# Patient Record
Sex: Female | Born: 1960 | Hispanic: No | Smoking: Former smoker
Health system: Southern US, Community
[De-identification: ages and names within clinical notes are randomized; demographics above are authoritative.]

## PROBLEM LIST (undated history)

## (undated) DIAGNOSIS — IMO0001 Reserved for inherently not codable concepts without codable children: Secondary | ICD-10-CM

## (undated) HISTORY — PX: NO PRIOR SURGERIES: 100

## (undated) HISTORY — DX: Reserved for inherently not codable concepts without codable children: IMO0001

## (undated) MED ORDER — ROBITUSSIN A-C 10-100 MG/5ML OR SYRP
ORAL_SOLUTION | ORAL | Status: DC
Start: 2001-10-01 — End: 2001-12-31

## (undated) MED ORDER — CLARITIN 10 MG OR TABS
ORAL_TABLET | ORAL | Status: DC
Start: 2000-08-13 — End: 2000-08-18

## (undated) MED ORDER — PROMETRIUM 100 MG OR CAPS
ORAL_CAPSULE | ORAL | Status: DC
Start: 2000-09-16 — End: 2001-11-26

## (undated) MED ORDER — PRENATAL/FOLIC ACID OR TABS
ORAL_TABLET | ORAL | Status: DC
Start: 2000-09-13 — End: 2001-11-26

## (undated) MED ORDER — ZYRTEC 10 MG OR TABS
ORAL_TABLET | ORAL | Status: DC
Start: 2000-08-18 — End: 2001-11-26

## (undated) MED ORDER — ENTEX PSE 120-600 MG OR TB12
EXTENDED_RELEASE_TABLET | ORAL | Status: DC
Start: 2001-10-01 — End: 2001-11-26

## (undated) MED ORDER — FLONASE INHA 50 MCG/DOSE NA
NASAL | Status: DC
Start: 2000-08-18 — End: 2001-11-26

## (undated) MED ORDER — PROGESTERONE 8 % VA GEL
VAGINAL | Status: DC
Start: 2000-11-02 — End: 2001-11-26

## (undated) MED ORDER — ERYTHROMYCIN BASE 333 MG OR TBEC
DELAYED_RELEASE_TABLET | ORAL | Status: DC
Start: 2001-10-01 — End: 2001-11-26

## (undated) DEATH — deceased

---

## 2000-08-13 ENCOUNTER — Encounter (INDEPENDENT_AMBULATORY_CARE_PROVIDER_SITE_OTHER): Payer: Self-pay | Admitting: Family Medicine

## 2000-08-13 ENCOUNTER — Ambulatory Visit (INDEPENDENT_AMBULATORY_CARE_PROVIDER_SITE_OTHER): Payer: PRIVATE HEALTH INSURANCE | Admitting: Family Medicine

## 2000-08-13 VITALS — BP 112/76 | HR 80 | Resp 16 | Wt 179.0 lb

## 2000-08-13 DIAGNOSIS — J309 Allergic rhinitis, unspecified: Secondary | ICD-10-CM

## 2000-08-13 NOTE — Progress Notes (Signed)
Connie [name] is a 39 year old female who presents complaining of sneezing, itchy watery eyes, sore throat for the past several weeks. Denies postnasal drip. States that at first thought that her symptoms were due to a URI but after 2-3 weeks when symptoms did not clear wondered if she was experiencing allergies. Symptoms are worse after sweeping and around perfumes. States that the allergies are the same indoor and outdoor. Tried OTC allergy meds which helped with sneezing. Does not smoke. Moved into current home back in April of 2001.    GNF:AOZHYQ of patient's past medical history indicates:   NO PAST MEDICAL HISTORY   MVH:QIONGE of patient's past surgical history indicates:   NO PAST SURGICAL HISTORY   Meds: denies  All:No Known Drug Allergies  PE:BP 112/76   Pulse 80   Resp 16   Wt 179 lbs (81.194 kg)  WEll developed well nourished female sitting on bed in NAD  HEENT:NCAT PEERL, conjunctiva noninjected, no discharge noted. Nares clear. Tm normal bilaterally. OP with MMM no erythema no exudates. Dentition good.  Neck:Soft, supple no LAN. No thyromegaly.    A/P:477.9 ALLERGIC RHINITIS NOS (primary encounter diagnosis)  Plan: CLARITIN TABS 10 MG OR   return prn failure to improve with above medication. Discussed avoidance of environmental triggers if patient is able to identify them.

## 2000-08-13 NOTE — Nursing Note (Signed)
>>   FATLAND, JODI     08/13/2000   10:16 am  Pt c/o allergic sx for some time.

## 2000-08-14 ENCOUNTER — Encounter (INDEPENDENT_AMBULATORY_CARE_PROVIDER_SITE_OTHER): Payer: Self-pay | Admitting: Family Medicine

## 2000-08-16 ENCOUNTER — Telehealth (INDEPENDENT_AMBULATORY_CARE_PROVIDER_SITE_OTHER): Payer: Self-pay | Admitting: Family Medicine

## 2000-08-16 NOTE — Telephone Encounter (Signed)
>>   BETSY VEVERKA Fri Aug 17, 2000 2:21 PM  Pt took her first dose of Clariton 4 days ago, but still has a stuffed nose   at night. She still has watery eyes and states her symptoms are the same.   She did get some relief with OTC medication. She has cleaned her room and   washed all linens.  Triaged per Eliot Ford and MD note,  appointment for tomorrow,  Suggested she use a humidifier at bedside.    >> PAT CARLTON Fri Aug 17, 2000 11:52 AM  [facility]    >> DIANE CROSS Fri Aug 17, 2000 8:55 AM  [facility]    >> DIANE CROSS Thu Aug 16, 2000 4:34 PM  [facility].    >> Wyline Copas Thu Aug 16, 2000 4:27 PM  >> CALL RECEIVED. Contact: Pt  Dr Brunilda Payor Pt   Pt was seen 08/13/00 and Rx'd Claritin. Pt states that there have been no effects and is wondering if she needs to wait a little longer. Pt may be reached at [phone] (home), VM ok if there are any suggestions.

## 2000-08-18 ENCOUNTER — Ambulatory Visit (INDEPENDENT_AMBULATORY_CARE_PROVIDER_SITE_OTHER): Payer: Self-pay | Admitting: Family Medicine

## 2000-08-18 ENCOUNTER — Ambulatory Visit (INDEPENDENT_AMBULATORY_CARE_PROVIDER_SITE_OTHER): Payer: PRIVATE HEALTH INSURANCE | Admitting: Internal Medicine

## 2000-08-18 VITALS — BP 106/82 | HR 80

## 2000-08-18 DIAGNOSIS — M79609 Pain in unspecified limb: Secondary | ICD-10-CM

## 2000-08-18 DIAGNOSIS — J309 Allergic rhinitis, unspecified: Secondary | ICD-10-CM

## 2000-08-18 NOTE — Progress Notes (Addendum)
Addended by: Cindi Carbon on: 08/19/2000,6:58 PM  Modules accepted: Progress Notes    The patient is a 39 year old year old female who presents for follow up of:    477.9 ALLERGIC RHINITIS NOS (primary encounter diagnosis)  Note: Attention is directed to the previous note in this chart. She has had no improvement with claritan. She has cat at n=home, FHA heat with uncleaned ducts. She stillhas itchy eyes, congestion and sore throat x one month. No previous known hx of all.    729.5 PAIN IN LIMB  Note: She complains of L 2nd toe cyts and nail deformity x a year or more.    There is no problem list on file for this patient.    Meds as of 08/18/2000:  ZYRTEC TABS 10 MG OR 1 PO QD  FLONASE INHA 50 MCG/DOSE NA 1 SPRAY EACH NOSTRIL QD REGARDLESS OF SYMPTOMS      Review of patient's past medical history indicates:   NO PAST MEDICAL HISTORY     Review of patient's past surgical history indicates:   NO PAST SURGICAL HISTORY   Review of patient's family history indicates:  No family history of allergies or asthma    Social History   Marital Status: Married     Social History Main Topics   Tobacco Use: Never          The remainder of the review of systems is negative.       PE: WD female in NAD, alert and cooperative  BP 106/82   Pulse 80  External ears and canals clear bilaterally. TM's normal bilaterally. Nose normal without lesions or discharge. Oropharynx normal. Neck supple without palpable adenopathy.    L 2nd toe cyst and nail deformity noted      A/P  477.9 ALLERGIC RHINITIS NOS (primary encounter diagnosis), possible to mold or dust or cat  Plan: ZYRTEC TABS 10 MG OR, FLONASE INHA 50 MCG/DOSE    NA   follow up if not resolved.     729.5 PAIN IN LIMB  Plan: CONSULT TO ORTHOPEDICS       Spent 15 min face to face couselling during this visit.

## 2000-08-18 NOTE — Nursing Note (Signed)
>>   Connie [name]     08/18/2000   9:36 am  Pt here for allergies. Sinus pressure at noc. Constant sore throat.   Keerbs placed her on Claritin a week ago and it has not worked.   Tried Actifed and it worked better.

## 2000-08-21 ENCOUNTER — Telehealth (INDEPENDENT_AMBULATORY_CARE_PROVIDER_SITE_OTHER): Payer: Self-pay | Admitting: Family Medicine

## 2000-08-21 NOTE — Telephone Encounter (Signed)
>>   Connie [name] Tue Aug 21, 2000 11:35 AM  Spoke w/pt. She is on Zyrtec x 3 days for allergies but it has not helped   yet; she only used the Flonase once b/c she is planning on getting   pregnant.  She tried to exercise right after using the Flonase yesterday, but became   quite dizzy so she stopped using it.  She is not sure what to do regarding her medications and her continuing   allergy sx.  Advised pt to continue the Zyrtec, OK to hold Flonase until seen tomorrow.    >> Connie [name] Tue Aug 21, 2000 11:15 AM  Pt returning Connie's call - no answer at ext. Pt apologized for being on   her phone, promises she won't use it until called back. Pt may be reached   at [phone] (home).    >> Connie [name] Tue Aug 21, 2000 11:04 AM  LMTCB.    >> Connie [name] Tue Aug 21, 2000 11:02 AM  >> CALL RECEIVED. Contact: Pt  Keerbs Pt.  Pt states that she was prescribed Zyrtec for her allergies, and she states that they are not helping her sx's. Pt would like a nurse to call her back to discuss this at [phone] (home)

## 2000-08-22 ENCOUNTER — Ambulatory Visit (INDEPENDENT_AMBULATORY_CARE_PROVIDER_SITE_OTHER): Payer: Self-pay | Admitting: Internal Medicine

## 2000-08-30 ENCOUNTER — Ambulatory Visit (INDEPENDENT_AMBULATORY_CARE_PROVIDER_SITE_OTHER): Payer: Self-pay | Admitting: Family Medicine

## 2000-09-06 ENCOUNTER — Ambulatory Visit (INDEPENDENT_AMBULATORY_CARE_PROVIDER_SITE_OTHER): Payer: Self-pay | Admitting: Family Medicine

## 2000-09-07 ENCOUNTER — Ambulatory Visit (INDEPENDENT_AMBULATORY_CARE_PROVIDER_SITE_OTHER): Payer: Self-pay | Admitting: Family Medicine

## 2000-09-13 ENCOUNTER — Ambulatory Visit (INDEPENDENT_AMBULATORY_CARE_PROVIDER_SITE_OTHER): Payer: PRIVATE HEALTH INSURANCE | Admitting: Family Medicine

## 2000-09-13 ENCOUNTER — Encounter (INDEPENDENT_AMBULATORY_CARE_PROVIDER_SITE_OTHER): Payer: Self-pay | Admitting: Family Medicine

## 2000-09-13 VITALS — BP 124/78 | HR 84 | Resp 16 | Wt 175.0 lb

## 2000-09-13 DIAGNOSIS — O039 Complete or unspecified spontaneous abortion without complication: Secondary | ICD-10-CM

## 2000-09-13 DIAGNOSIS — Z139 Encounter for screening, unspecified: Secondary | ICD-10-CM

## 2000-09-13 DIAGNOSIS — Z0000 Encounter for general adult medical examination without abnormal findings: Secondary | ICD-10-CM

## 2000-09-13 NOTE — Progress Notes (Signed)
SUBJECTIVE: Connie [name] is a 39 year old female for an annual health maintenance exam. She is a new patient to me, though has been seen in the clinic. Other concerns today include the following:   1. Is planning a pregnancy soon. See OB hx. Used progesterone supps for her successful pregnancy and would like to use these again. I discuss the utility of assessing a BBT chart to assess luteal phase.  2. Has had some sneezing and ST over the past few mos - the Claritin, Zyrtec and Flonase weren't very helpful, but used the latter 2 only a few d each. Actifed seemed more helpful than these. Not bothering her much now, and would rather not treat currently anyway as will be attempting pregnancy.    HCM TOPICS: Paps nl. Diet, exercise, BSE and calcium discussed. Hasn't had a mammo prev.    Review of patient's past medical history indicates:   NO PAST MEDICAL HISTORY   Review of patient's past surgical history indicates:   NO PAST SURGICAL HISTORY   Obstetric History   G3 P1 T1 P0 TAB0 SAB2 E0 M0 L1 - had a w/u for her SAbs; had a successful pregnancy thereafter while on progesterone suppositories.    MEDS:Current prescriptions: none currently    Social History   Marital Status: Married    Tobacco Use: Never     REVIEW OF SYSTEMS: Menses q 28, regular, not painful.    PHYSICAL EXAM:BP 124/78   Pulse 84   Resp 16   Wt 175 lbs (79.379 kg)   LMP 08/22/2000  HEENT: NC/AT. Ear canals clear. TM's normal bilaterally. Nose without lesions nor discharge. Gums and dentition normal. No sinus tenderness. Oropharynx normal. Neck supple; without palpable adenopathy. No thyroid enlargement or discrete nodule.  LUNGS: The lungs are clear to auscultation.  CV: Regular rate and rhythm. S1 and S2 normal, no murmurs, clicks, gallops or rubs. No edema or JVD.  BREASTS: Breasts are symmetric. No dominant, discrete, fixed or suspicious masses are noted. No skin or nipple changes or axillary adenopathy.  ABDOMEN: The abdomen is nondistended. Auscultation  reveals normal bowel sounds. No HSM nor mass noted by percussion and palpation. No tenderness to palpation.  GENITAL: Vagina and vulva are normal; no discharge is noted. Cervix normal. Uterus mobile, normal in size and shape without tenderness. Adnexa normal without masses or tenderness.  SKIN: No rashes or suspicious skin lesions noted. No abnormal hair changes or loss is noted.    ASSESSMENT AND PLAN:  V70.0 ROUTINE MEDICAL EXAM (primary encounter diagnosis)  Plan: PAP SMEAR (CYTOPATH, GYN)-[facility], SPECIMEN HANDLING FEE DR->LAB, MAMMOGRAM, BOTH BREASTS. Wellness counselling regarding all issues under HCM topics given.     V26.4 PROCREATIVE MGMT-COUNSEL  Plan: PRENATAL/FOLIC ACID TABS, RUBELLA IMMUNE STATUS, SEROL. I review the optimum time for trying to get pregnant - 3 days prior to the day after ovulation. I note decreased maternal caffeine intake may aid fertility. She should be on a PNV with a mg of folate to decrease the risk of spina bifida. Although I note that it would be advisable for her to assess her luteal phase by BBT or hormone level, she notes that she would prefer to use progesterone supps again given she has some urgency in getting pregnant given her age.     V82.9 SCREENING FOR CONDITION NOS  Plan: LIPID PANEL (RTC fasting)

## 2000-09-16 ENCOUNTER — Telehealth (INDEPENDENT_AMBULATORY_CARE_PROVIDER_SITE_OTHER): Payer: Self-pay | Admitting: Family Medicine

## 2000-09-16 ENCOUNTER — Encounter (INDEPENDENT_AMBULATORY_CARE_PROVIDER_SITE_OTHER): Payer: Self-pay | Admitting: Family Medicine

## 2000-09-16 DIAGNOSIS — O039 Complete or unspecified spontaneous abortion without complication: Secondary | ICD-10-CM | POA: Insufficient documentation

## 2000-09-16 DIAGNOSIS — Z3169 Encounter for other general counseling and advice on procreation: Secondary | ICD-10-CM | POA: Insufficient documentation

## 2000-09-16 NOTE — Telephone Encounter (Signed)
>>   Boyd Kerbs Sun Sep 16, 2000 2:39 PM  CALL INITIATED. Contact:   LM that micronized prog can be used instead of suppositories, as I have previously discussed w/ Fertility; typically 100 mg of Prometrium tid. Start with BBT spike and continue till period or till 8 wks. Will mail. There is a risk, albeit small, of hypospadias if had a boy.

## 2000-09-18 LAB — CERVICAL CANCER SCREENING: Cytologic Impression: NEGATIVE

## 2000-09-21 ENCOUNTER — Other Ambulatory Visit (INDEPENDENT_AMBULATORY_CARE_PROVIDER_SITE_OTHER): Payer: Self-pay | Admitting: Family Medicine

## 2000-09-21 DIAGNOSIS — Z139 Encounter for screening, unspecified: Secondary | ICD-10-CM

## 2000-11-02 ENCOUNTER — Telehealth (INDEPENDENT_AMBULATORY_CARE_PROVIDER_SITE_OTHER): Payer: Self-pay | Admitting: Family Medicine

## 2000-11-02 NOTE — Telephone Encounter (Signed)
>>   AIMEE HARGRAVE Fri Nov 02, 2000 6:45 PM  Called in to [facility] on [phone], and informed pt where   to pick up, pt has used this before and had no questions.    >> Boyd Kerbs Fri Nov 02, 2000 6:16 PM  Please phone in the rx to the pt's desired pharmacy (below) and let the pt   know this was done. If they don't do these, try [facility], and if not I am   sure [facility] would do. Thanks    >> JODI [facility] Fri Nov 02, 2000 10:58 AM  Please advise, thanks.    >> Alric Seton Fri Nov 02, 2000 10:41 AM  >> COMPLETED ON Fri Nov 02, 2000 10:45 AM  Pt is requesting to get the Progesterone Suppositories/ she is not really   wanting to take the pills with that slight risk of side effects.    Pharmacy information is below/ she is wanting to be sure that when staff   calls this in / that they have this. If not she would like to see what   pharmacies do have.     Pt given information on her Mammo with number to call to schedule this.    REFILL CALL  ------------  DOB: 1961/01/13  Medication(s) requested:Progesteron Supp  How many days of meds. left? 0  Pharmacy name and location:[facility] [address]   Pharmacy phone #:[phone]  Written prescription needed? Yes an orgianl rx for this was not written by   Dr. Emogene Morgan first and last name: Connie [name]  Caller's relation to pt: SELF  Tel. # to reach caller: [phone] (home)   Okay leave VM or msg with someone?YES  Pt has been informed of the 24 to 48 hour refill policy    >> PAMELA ALLEN Fri Nov 02, 2000 10:25 AM  >> CALL RECEIVED. Contact: pt  Dr. Johnston Ebbs pt    MEDICATION REQUEST    Pt states that she was into see Dr. Johnston Ebbs re this issue about a week ago and they discussed her taking Progesterone Suppositories for prevention of miscarriages.    REFILL CALL  ------------  DOB: 12-08-1960  Medication(s) requested:Progesteron Supp  How many days of meds. left? 0  Pharmacy name and location:[facility] [address]   Pharmacy phone  #:[phone]  Written prescription needed? Yes an orgianl rx for this was not written by Dr. Emogene Morgan first and last name: Connie [name]  Caller's relation to pt: SELF  Tel. # to reach caller: [phone] (home)   Okay leave VM or msg with someone?YES  Pt has been informed of the 24 to 48 hour refill policy

## 2001-05-27 ENCOUNTER — Other Ambulatory Visit: Admission: RE | Admit: 2001-05-27 | Discharge: 2001-05-27 | Payer: Self-pay | Admitting: Family Medicine

## 2001-10-01 ENCOUNTER — Ambulatory Visit (INDEPENDENT_AMBULATORY_CARE_PROVIDER_SITE_OTHER): Payer: PPO | Admitting: Internal Medicine

## 2001-10-01 VITALS — BP 102/64 | HR 88 | Temp 98.6°F | Resp 16 | Wt 178.0 lb

## 2001-10-01 NOTE — Progress Notes (Signed)
SUBJECTIVE:  Connie [name] is an 40 year old female who presents with URI. Symptoms include   congestion, coryza and sore throat. Onset 2 weeks, gradually worsening since   that time.     Meds as of 10/01/2001:  PROGESTERONE (VAGINAL) 8 % VA GEL 1 application of 8% progesterone vaginal gel qd for 10-12 d if pregnancy occurs; 1 application=90 mg  PROMETRIUM 100 MG OR CAPS 1 po tid  PRENATAL/FOLIC ACID TABS OR 1 TABLET DAILY  ZYRTEC 10 MG OR TABS 1 PO QD  FLONASE INHA 50 MCG/DOSE NA 1 SPRAY EACH NOSTRIL QD REGARDLESS OF SYMPTOMS      Review of patient's allergies indicates:   No Known Drug *      Tobacco Use: Never    Alcohol Use: Not Asked       OBJECTIVE:  BP 102/64   Pulse 88   Temp 98.6   Temp Src: Oral   Resp 16   Wt 178 lbs (80.740 kg)  General appearance: healthy, alert, no distress  Ears: R TM - normal, L TM - normal  Nose: normal  Oropharynx: mild erythema  Neck: normal, supple and no adenopathy  Lungs: clear to auscultation  Heart: normal rate, regular rhythm and no murmurs, clicks, or gallops    ASSESSMENT:  Acute URI    PLAN:  1) Symptomatic treatment with fluids, vaporizer, acetaminophen.  2) OTC cold medications.  3) Recheck as needed for persistence, worsening, appearance of new   symptoms.    PE:  Discussed viral etiology of URI and treatment rationale.  not to take abx unless not improved in 2-3 days

## 2001-11-26 ENCOUNTER — Ambulatory Visit (INDEPENDENT_AMBULATORY_CARE_PROVIDER_SITE_OTHER): Payer: PPO | Admitting: Family Medicine

## 2001-11-26 VITALS — BP 110/72 | HR 76 | Temp 98.4°F | Wt 178.0 lb

## 2001-11-26 MED ORDER — ALBUTEROL 90 MCG/ACT IN AERS
INHALATION_SPRAY | RESPIRATORY_TRACT | Status: DC
Start: 2001-11-26 — End: 2001-12-31

## 2001-11-26 MED ORDER — KEFLEX 500 MG OR CAPS
ORAL_CAPSULE | ORAL | Status: DC
Start: 2001-11-26 — End: 2001-12-31

## 2001-11-26 NOTE — Progress Notes (Signed)
SUBJECTIVE:  Connie [name] is a 40 year old female established patient who presents concerned about  1) prior URI lasted about 1 month   then 3 days ago she woke with a totally hoarse voice, green nasal d/c once this am     Review of patient's allergies indicates:    No Known Drug *   Current prescriptions:   ROBITUSSIN A-C 10-100 MG/5ML OR SYRP Take 1 to 2 teaspoonfuls every 4 to 6 hours as needed for cough    Tobacco Use: Never           Alcohol Use: No   trying to conceive but denies pregnancy   no N/V/D   no rash  OBJECTIVE:  BP 110/72   Pulse 76   Temp 98.4   Temp Src: Oral   Wt 178 lbs (80.740 kg)   LMP 11/16/2001   Skin:well perfused ,no rash.  Heent: NCAT, OP red some exudate ,shotty ant and no post cervical adenopathy , thick yellowish nasal d/c , neck is supple . No thyromegaly  TMs Rt nl  Lt nl  Chest:CTA, No wheezing.  COR: RRR no m,r,g.  No JVD or edema  Abd: +BS, soft NT no organomegaly     ASSESSMENT:  1)  461.9 ACUTE SINUSITIS NOS  (primary encounter diagnosis)  Current prescriptions:  KEFLEX 500 MG OR CAPS Take 1 capsule by mouth 2 times per day until gone ROBITUSSIN A-C 10-100 MG/5ML OR SYRP Take 1 to 2 teaspoonfuls every 4 to 6 hours as needed for cough       786.2 COUGH  Note: reviewed warning signs seek care if worse   ALBUTEROL 90 MCG/ACT IN AERS Inhale 2 puffs every 4 to 6 hours as needed

## 2001-11-26 NOTE — Nursing Note (Signed)
>>   [name], Connie A              11/26/2001 11:26 am  c/o loss of voice x 3 days

## 2001-12-31 ENCOUNTER — Telehealth (INDEPENDENT_AMBULATORY_CARE_PROVIDER_SITE_OTHER): Payer: Self-pay | Admitting: Internal Medicine

## 2001-12-31 ENCOUNTER — Ambulatory Visit (INDEPENDENT_AMBULATORY_CARE_PROVIDER_SITE_OTHER): Payer: PPO | Admitting: Internal Medicine

## 2001-12-31 ENCOUNTER — Encounter (INDEPENDENT_AMBULATORY_CARE_PROVIDER_SITE_OTHER): Payer: Self-pay | Admitting: Internal Medicine

## 2001-12-31 NOTE — Nursing Note (Signed)
>>   RODGERS, SHARON                   12/31/2001 2:32 pm  Pt here to have adoption forms filled out.

## 2001-12-31 NOTE — Telephone Encounter (Signed)
>>   Connie [name] Wed Jan 01, 2002 12:18 PM  ok    >> Connie [name] Wed Jan 01, 2002 12:17 PM  Test can be added on to sample drawn. Need ok/order from Dr.    >> Connie [name] Wed Jan 01, 2002 9:28 AM  Tammy Sours or [place], can this be done?    >> MARK J HOBBS Tue Dec 31, 2001 5:17 PM  Dr Festus Aloe pt    Pt states she had blood draws at today's OV and is inquiring if it is too late for her cholesterol level to also be checked with the existing blood sample. Pt may be reached at [phone] (home) .

## 2001-12-31 NOTE — Progress Notes (Signed)
This  patient, Connie [name] is a 41 year old year old female who presents with the following:      V70.3 MED EXAM NEC-ADMIN PURP  Note: She has pre adoption forms to be completed          A/PV70.3 MED EXAM NEC-ADMIN PURP  Note: See forms scanned to the chart.  Plan: IMMUNIZATION ADMIN, SINGLE IMM, TD 59 YEARS OLD        AND GREATER, PPD, HIV 1&2 AB SCREEN, URINE DRUG        ABUSE SCREEN, QUALI              I spent 25 min face to face couselling  and coordination during this visit.

## 2002-01-01 LAB — URINE DRUG ABUSE SCREEN, QUALI
Alcohol (Ethyl), URN: NEGATIVE mg/dL
Amphetamine (Qual), URN: NEGATIVE
Barbiturate (Qual), URN: NEGATIVE
Benzodiazepines (Qual), URN: NEGATIVE
Cocaine (Qual), URN: NEGATIVE
Methadone (Qual), URN: NEGATIVE
Opiates (Qual), URN: NEGATIVE
Phencyclidine (Qual), URN: NEGATIVE
Tricyclic Antidepressants, URN: NEGATIVE

## 2002-01-01 LAB — HIV 1&2 AB SCREEN

## 2002-01-03 ENCOUNTER — Telehealth (INDEPENDENT_AMBULATORY_CARE_PROVIDER_SITE_OTHER): Payer: Self-pay | Admitting: Internal Medicine

## 2002-01-03 ENCOUNTER — Ambulatory Visit (INDEPENDENT_AMBULATORY_CARE_PROVIDER_SITE_OTHER): Payer: PPO

## 2002-01-03 LAB — LIPID PANEL
Cholesterol/HDL Ratio: 6.1
HDL Cholesterol: 42 mg/dL (ref >40)
Total Cholesterol: 256 mg/dL — ABNORMAL HIGH (ref <200)
Triglyceride: 576 mg/dL — ABNORMAL HIGH (ref <150)

## 2002-01-03 LAB — PR PPD/TUBERCULIN SKIN TEST 5 UNITS / 0.1 ML INTRADERMAL

## 2002-01-03 NOTE — Telephone Encounter (Signed)
Encounter initiated.

## 2002-01-09 ENCOUNTER — Encounter (INDEPENDENT_AMBULATORY_CARE_PROVIDER_SITE_OTHER): Payer: Self-pay | Admitting: Internal Medicine

## 2002-01-16 ENCOUNTER — Institutional Professional Consult (permissible substitution): Payer: Self-pay | Admitting: Dermatology

## 2002-01-20 ENCOUNTER — Telehealth (INDEPENDENT_AMBULATORY_CARE_PROVIDER_SITE_OTHER): Payer: Self-pay | Admitting: Internal Medicine

## 2002-01-20 NOTE — Telephone Encounter (Signed)
>>   Connie [name] Mon Jan 20, 2002 12:35 PM  Informed pt papers were mailed to adoption service.    >> [name] [facility] Mon Jan 20, 2002 11:43 AM  Pt of Dr Festus Aloe    Pt requesting c\b in regards to adoption paperwork that needs to be filled out and returned to pt asap. Pt may be reached at [phone] (home) .

## 2002-01-22 ENCOUNTER — Encounter (INDEPENDENT_AMBULATORY_CARE_PROVIDER_SITE_OTHER): Payer: Self-pay | Admitting: Internal Medicine

## 2002-03-27 ENCOUNTER — Telehealth (INDEPENDENT_AMBULATORY_CARE_PROVIDER_SITE_OTHER): Payer: Self-pay | Admitting: Internal Medicine

## 2002-03-27 NOTE — Telephone Encounter (Signed)
>>   [name] Mar 29, 2002 10:35 AM  Pt notified of below.    >> [name] Fri Mar 28, 2002 8:53 AM  L/M for pt to call office.    >> [name] Thu Mar 27, 2002 4:48 PM  left message to call    >> [name] Thu Mar 27, 2002 3:39 PM  Please call her and let her know:  1. appointment is enough  2. recovery time: She will have a little skin irritation or blistering at the sites of skin tags for up to one week. Not likley to impair normal activitities, but may be cosmetically unexceptable for special events over that recovery period.  3. the treated areas are no more sunsensitive than normal skin.      >> [name] Mar 27, 2002 3:04 PM  Scheduled for skin tag removal 5/22. Has ten tags. Wants to know if one appt is enough time to remove them all? What is the recovery time? Are the removed tag sites sun-sensative? (starting vacation soon after appt). Please call: [phone]. Leave msg.

## 2002-03-27 NOTE — Telephone Encounter (Signed)
>>   [name] Thu Mar 27, 2002 4:34 PM  done      >> DANA CONDOLORA Thu Mar 27, 2002 9:50 AM  Pt notified re completed adoption paperwork. States she will be in clinic today to sign release of information to adoption agency representative Arvella Nigh. dc    >> [name] Thu Mar 27, 2002 8:27 AM  Please call her and let her know that I have completed her forms for adoption from [place]. I cannot return them to Arvella Nigh because I don't have a signed release of information to release her PHI.

## 2002-04-17 ENCOUNTER — Ambulatory Visit (INDEPENDENT_AMBULATORY_CARE_PROVIDER_SITE_OTHER): Payer: PPO | Admitting: Internal Medicine

## 2002-04-17 NOTE — Nursing Note (Signed)
>>   RODGERS, SHARON                 04/17/2002 10:13 am  Pt here for removal of skin tags.  Cholesterol check.

## 2002-04-17 NOTE — Progress Notes (Signed)
The patient is a 41 year old year old female who presents for follow up of:    272.4 HYPERLIPIDEMIA NEC/NOS  Note: She is on ww and wants to rechekc lipids.     701.9 SKIN HYPERTRO/ATROPH NOS  Note: Desires removal of skin tags. They are irritated by her clothing.      PE: WD female in NAD, alert and cooperative  BP 118/64   Pulse 82   Resp 14   Wt 167 lbs (75.751 kg)  8 tags ranging 1-7 mm at R ax, 1 tag @ R inframamary 4 mm,    3 @ L axilla1-4 mm    Frozen with LN2 x 2 x20 sec      A/P  272.4 HYPERLIPIDEMIA NEC/NOS  Note: She need labs checked.   Plan: LIPID PANEL, VENIPUNCTURE FEE, SPECIMEN    HANDLING FEE DR->LAB   I will notify patient of test results via mail.     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

## 2002-04-18 ENCOUNTER — Other Ambulatory Visit (INDEPENDENT_AMBULATORY_CARE_PROVIDER_SITE_OTHER): Payer: Self-pay

## 2002-04-24 ENCOUNTER — Other Ambulatory Visit (INDEPENDENT_AMBULATORY_CARE_PROVIDER_SITE_OTHER): Payer: PPO | Admitting: Internal Medicine

## 2002-04-24 ENCOUNTER — Other Ambulatory Visit (INDEPENDENT_AMBULATORY_CARE_PROVIDER_SITE_OTHER): Payer: PPO

## 2002-04-24 LAB — LIPID PANEL
Cholesterol (LDL): 69 mg/dL (ref <130)
Cholesterol/HDL Ratio: 3.4
HDL Cholesterol: 36 mg/dL — ABNORMAL LOW (ref >40)
Total Cholesterol: 123 mg/dL (ref <200)
Triglyceride: 90 mg/dL (ref <150)

## 2002-04-25 ENCOUNTER — Telehealth (INDEPENDENT_AMBULATORY_CARE_PROVIDER_SITE_OTHER): Payer: Self-pay | Admitting: Internal Medicine

## 2002-04-25 NOTE — Telephone Encounter (Signed)
>>   Connie [name] Wed Apr 30, 2002 5:13 PM  Ordered by Luisa Dago. Discussed with patient and husband. Reviewed lipid.    >> [name] Fri Apr 25, 2002 6:11 PM  Pt's spouse called to inquire as to why the pt's Rx for [facility]  had not been called in to pharmacy as of yet. I see no meds  listed for this pt and nothing in the chart documenting an  order. The pt's spouse said the order was for "the ring"-he said you gave pt a sample last week.

## 2002-06-06 ENCOUNTER — Encounter (INDEPENDENT_AMBULATORY_CARE_PROVIDER_SITE_OTHER): Payer: Self-pay | Admitting: Internal Medicine

## 2002-06-06 ENCOUNTER — Ambulatory Visit (INDEPENDENT_AMBULATORY_CARE_PROVIDER_SITE_OTHER): Payer: PPO | Admitting: Internal Medicine

## 2002-06-06 VITALS — BP 128/74 | HR 58 | Temp 100.5°F | Wt 163.5 lb

## 2002-06-06 LAB — CBC, DIFF
% Basophils: 1 %
% Eosinophils: 2 %
% Lymphocytes: 25 %
% Monocytes: 7 %
% Neutrophils: 65 %
Absolute Eosinophil Count: 0.13 10*3/uL (ref 0–0.50)
Absolute Lymphocyte Count: 1.75 10*3/uL (ref 1.00–4.80)
Basophils: 0.05 10*3/uL (ref 0–0.20)
Hematocrit: 38 % (ref 36–45)
Hemoglobin: 12.8 g/dL (ref 11.5–15.5)
MCH: 29.7 pg (ref 27.3–33.6)
MCHC: 33.6 g/dL (ref 32.3–35.7)
MCV: 88 fL (ref 81–98)
Monocytes: 0.45 10*3/uL (ref 0–0.80)
Neutrophils: 4.55 10*3/uL (ref 1.80–7.00)
Platelet Count: 192 10*3/uL (ref 150–400)
RBC: 4.33 mil/uL (ref 3.80–5.00)
WBC: 6.94 10*3/uL (ref 4.3–10.0)

## 2002-06-06 MED ORDER — TYLENOL WITH CODEINE #3 300-30 MG OR TABS
ORAL_TABLET | ORAL | Status: DC
Start: 2002-06-06 — End: 2003-03-11

## 2002-06-06 NOTE — Progress Notes (Signed)
06/06/2002  3:32 PM  Chart Reviewed.  Chief Complaint: 41 year old female presents with the following issues on which she wishes to focus attention:  cough    SUBJECTIVE:  1) COUGH: She had sneeezing and cough 8-9 d ago. She had no fever or ST or myalgia. Denies any hx of allergies. Hasn't felt feverish. No nausea or diarrhea. Just before this started she arrived home from a trip in which she was in [place] and [address]. Her brother had asthma, but she never did, although she says colds often go to her chest.     ROS: Constitutional: n  Eyes: n  Ears/Nose/Mouth/Throat: as above   Cardiovascular: n  Respiratory: no wheezing but the cough is worse with exertion and at night. Though it doens't wake her at night.   GI: n  GU: n  Musculoskeletal: n  Skin: n  Neurological: n     Current level of aerobic exercise: works out regularly    PROBLEM LIST: Patient Active Problem List:    PROCREATIVE MGMT-COUNSEL[V26.4]   H/O SPON ABORT x 2[634.90]    PMHx: Review of patient's past medical history indicates:   NO PAST MEDICAL HISTORY     Medications: Meds as of 06/06/2002:  NUVARING 0.12-0.015 MG/24HR VA RING weekly     SocHx: Social History   Marital Status: Married Spouse Name:    Years of Education: Number of children:   Social History Main Topics   Tobacco Use: Quit Packs/Day: .5 Years: 10    Quit date: 08/27/1989   Alcohol Use: Yes    Comment: 1-2 every coulpe of weeks   Drug Use: No    Sexually Active: Yes Partners with: Female   Comment: mutually mongamous since 34    Social History Narrative   Mom for 1 child, husband is a Administrator. Has lived in [place] since 1993.     OBJECTIVE:  VS: BP 128/74   Pulse 58   Temp 100.5   Wt 163 lbs 8 oz (74.163 kg)  General appearance: Weight appears normal for height. No acute distress.  Affect and demeanor are pleasant and outgoing. Normal cognition and thought process. Expresses thoughts coherently and fluently. No vegetative signs, no repetitive movements, no evidence of paranoid  ideation and no expression of self destructive thoughts.  HEENT: Pupils equal, reactive to light and accomodation. Sclerae and conjunctivae clear. Fundi benign. External ears and canals clear. TM's normal bilaterally. Nose normal without lesions or discharge. Gums and dentition normal. No sinus tenderness. Oropharynx normal. Neck supple without palpable adenopathy. No thyroid enlargemant or discrete nodules noted.  LUNGS: The lungs are clear to auscultation.    ASSESSMENT/PLAN:    786.2 COUGH (primary encounter diagnosis)  Note: doubt bacterial but will check with CBC  Plan: CBC, DIFF, VENIPUNCTURE FEE, SPECIMEN HANDLING    FEE DR->LAB, TYLENOL/CODEINE #3 300-30 MG OR    TABS   fluids for expectorant.      F/U: Call or return to clinic prn if these symptoms worsen, fail to improve as anticipated, or if new symptoms develop.     Patient Ed: Discussed in detail all aspects of the patients care described above. We agreed to discuss at a future appointment any topics for which there was insufficient time at todays meeting    Note to reviewer. The content of this evaluation includes items in the medical record such as allergies and aspects of past history items recorded in the chart, which may not have been specifically entered  again in this particular entry.

## 2002-06-06 NOTE — Nursing Note (Signed)
>>   AVAKIAN, DEAN                    06/06/2002 3:08 pm  cough and sore throat x 6 days.

## 2002-06-11 ENCOUNTER — Telehealth (INDEPENDENT_AMBULATORY_CARE_PROVIDER_SITE_OTHER): Payer: Self-pay | Admitting: Internal Medicine

## 2002-06-11 NOTE — Telephone Encounter (Signed)
>>   Connie [name] Mon Jun 16, 2002 1:21 PM  Pt. seen 06/13/02    >> Connie [name] Wed Jun 11, 2002 10:56 AM  Pt. seen 06/06/02 [name]) for cough, CBC normal. Attempted to reach pt. not at home at this time. Msg left w/ spouse to have pt. return call to office    >> [name] Wed Jun 11, 2002 9:31 AM  pt of [name], MD    Pt stated she thinks her Sx are taking a turn for the worse and requested a cb from an MA to discuss. Pt declined a SDA until after speaking to an MA.    RESULTS CALL  -------------  Results for what test(s)?Culture  Where was the test done?: here  When was test(s) done?: 06/06/02  Preferred phone number: after 11:30 am today at [phone] (home)   Okay to leave VM or msg with someone?YES

## 2002-06-13 ENCOUNTER — Ambulatory Visit (INDEPENDENT_AMBULATORY_CARE_PROVIDER_SITE_OTHER): Payer: PPO | Admitting: Internal Medicine

## 2002-06-13 VITALS — BP 100/80 | HR 88 | Temp 99.4°F | Resp 14 | Wt 163.0 lb

## 2002-06-13 NOTE — Progress Notes (Signed)
06/13/2002  3:35 PM  Chart Reviewed.  Chief Complaint: 41 year old female presents with the following issues on which she wishes to focus attention:  UTI    SUBJECTIVE:  1) COUGH Her WBC was normal. Since then it has changed. She gets increased coughing in the evening. She denies wheezing, but thinks she can't take a full breath without coughing. She isn't really SOB and has been able to workout without the cough getting worse. She still has a low grade fever.     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]   H/O SPON ABORT x 2[634.90]     PMHx: Review of patient's past medical history indicates:   NO PAST MEDICAL HISTORY     Medications: Meds as of 06/13/2002:  TYLENOL/CODEINE #3 300-30 MG OR TABS 1-2 PO Q 4-6 For Cough  NUVARING 0.12-0.015 MG/24HR VA RING weekly     SocHx: Social History   Marital Status: Married Spouse Name:    Years of Education: Number of children:   Social History Main Topics   Tobacco Use: Quit Packs/Day: .5 Years: 10    Quit date: 08/27/1989   Alcohol Use: Yes    Comment: 1-2 every coulpe of weeks   Drug Use: No    Sexually Active: Yes Partners with: Female   Comment: mutually mongamous since 54    Social History Narrative   Mom for 1 child, husband is a Administrator. Has lived in [place] since 1993.     OBJECTIVE:  VS: BP 100/80   Pulse 88   Temp 99.4   Temp Src: Oral   Resp 14   Wt 163 lbs (73.936 kg)  General appearance: Weight appears normal for height. No acute distress.  LUNGS: The lungs are clear to auscultation There is expiratory prolongation on forced expiration.    ASSESSMENT/PLAN:    786.2 COUGH (primary encounter diagnosis)  Note: viral bronchitis,  Plan: SPIROMETRY,  ONSITE PRE/POST TX   normal  Fluids, tylenol with codeine and more time. 25 minutes spent reviewing the interim history and counseling the patient .      F/U: if   Patient Ed: Discussed in detail all aspects of the patients care described above. We agreed to discuss at a future appointment any topics for which there was insufficient time at todays meeting    Note to reviewer. The content of this evaluation includes items in the medical record such as allergies and aspects of past history items recorded in the chart, which may not have been specifically entered again in this particular entry.

## 2002-08-22 ENCOUNTER — Ambulatory Visit (INDEPENDENT_AMBULATORY_CARE_PROVIDER_SITE_OTHER): Payer: PPO | Admitting: Family Medicine

## 2002-08-22 VITALS — BP 116/70 | HR 80 | Temp 99.1°F | Resp 18 | Ht 61.0 in | Wt 166.0 lb

## 2002-08-22 MED ORDER — CIPROFLOXACIN TABS 500 MG OR
ORAL_TABLET | ORAL | Status: DC
Start: 2002-08-22 — End: 2002-09-04

## 2002-08-22 MED ORDER — AMBIEN 10 MG OR TABS
ORAL_TABLET | ORAL | Status: DC
Start: 2002-08-22 — End: 2002-08-31

## 2002-08-22 MED ORDER — VIVOTIF BERNA VACCINE CPEC   OR
ENTERIC_COATED_CAPSULE | ORAL | Status: DC
Start: 2002-08-22 — End: 2003-03-11

## 2002-08-22 NOTE — Progress Notes (Signed)
Addended by: Barbaraann Barthel T on: 09/08/2002 11:38:31 AM     Modules accepted: Orders     Connie [name] is a 41 year old female for an adoption exam. Other concerns today include the following:    discussion of travel shots and prevention for travel to [place]   They have a 42 yr old boy   Her husband Gery Pray will join her in 2 weeks after her arrival they leave in 4 weeks   She had her pap and annual <1 yr ago    forms completed   PPD neg HIV NEG       Review of patient's allergies indicates:   No Known Drug *     Current outpatient prescriptions:   NUVARING 0.12-0.015 MG/24HR VA RING, weekly, Disp: 0, Rfl: 0   Tobacco Use: Quit Packs/Day: .5 Years: 2    Quit date: 08/27/1989   Alcohol Use: Yes    Comment: 1-2 every couple of weeks      ROS no shortness of breath or chest pain   no injuries that would stop her from travel   OBJECTIVE:   BP 116/70   Pulse 80   Temp 99.1   Temp Src: Oral   Resp 18   Ht 5\' 1"  (1.537m)   Wt 166 lbs (75.297 kg)   LMP 07/25/2002   comfortable appearing female in NAD     General: healthy, alert, no distress  Skin: Skin color, texture, turgor normal. No rashes or concerning lesions.  Head: Normocephalic. No masses, lesions, tenderness or abnormalities  Nose:normal  Oropharynx: Lips, mucosa, and tongue normal. Teeth and gums normal., posterior pharynx without erythema or drainage  Neck: Neck supple. No adenopathy. Thyroid symmetric, normal size, without nodules  Lungs: clear to auscultation  Heart: normal rate, regular rhythm and no murmurs, clicks, or gallops   ASSESSMENT   V70.3 MED EXAM NEC-ADMIN PURP (primary encounter diagnosis)  Note: forms and exam for adoption and travel to [employer] [place]     V06.4 VACCINE MEASLE-MUMPS-RUBELLA  Plan: MMR VIRUS IMMUNIZATION ADULT       V05.3 VACCINE FOR VIRAL HEPATITIS  Plan: HEP A VACCINE ADULT IM       V04.0 VACCINE FOR POLIOMYELITIS  Plan: POLIOMYELITIS IMMUNIZATN,INACTV,SQ     sent with rx for vivotef and advised on its use.   also advised on water,  cipro use for bloody febrile diarrhea   and on immodium use.   reviewed warning signs seek care if worse

## 2002-11-04 ENCOUNTER — Ambulatory Visit (INDEPENDENT_AMBULATORY_CARE_PROVIDER_SITE_OTHER): Payer: Self-pay

## 2002-11-05 ENCOUNTER — Telehealth (INDEPENDENT_AMBULATORY_CARE_PROVIDER_SITE_OTHER): Payer: Self-pay | Admitting: Internal Medicine

## 2002-11-05 ENCOUNTER — Ambulatory Visit (INDEPENDENT_AMBULATORY_CARE_PROVIDER_SITE_OTHER): Payer: PPO | Admitting: Family Medicine

## 2002-11-05 VITALS — BP 110/78 | HR 126 | Temp 101.8°F | Resp 42 | Wt 168.0 lb

## 2002-11-05 MED ORDER — ALBUTEROL 90 MCG/ACT IN AERS
INHALATION_SPRAY | RESPIRATORY_TRACT | Status: DC
Start: 2002-11-05 — End: 2003-03-11

## 2002-11-05 MED ORDER — AZITHROMYCIN 250 MG OR TABS
ORAL_TABLET | ORAL | Status: DC
Start: 2002-11-05 — End: 2002-11-10

## 2002-11-05 MED ORDER — PREDNISONE 20 MG OR TABS
ORAL_TABLET | ORAL | Status: DC
Start: 2002-11-05 — End: 2003-03-11

## 2002-11-05 MED ORDER — ROBITUSSIN A-C 10-100 MG/5ML OR SYRP
ORAL_SOLUTION | ORAL | Status: DC
Start: 2002-11-05 — End: 2002-11-10

## 2002-11-05 NOTE — Progress Notes (Signed)
Connie [name] is a 41 year old female who initially had 3 days of fever to 102-103. she is now coughing all day and at night. New yellowish green productive sputum Symptoms responded to turning the shower on and standing in the bathroom  no hx of RAD  son zack is ill   returned after adopting a young girl from [place]    Review of patient's allergies indicates:   No Known Drug *     Current outpatient prescriptions: nuvaring   not pregnant     no tob exposure   No rash \\no  shortness of breath   BP 110/78   Pulse 126   Temp 101.8   Temp Src: Oral   Resp 42   Wt 168 lbs (76.204 kg)   LMP 10/13/2002  Skin:well perfused ,no rash.  Heent: NCAT, OP red no exudate , no ant and post cervical adenopathy , neck is supple . No thyromegaly  TMs Rt nl Lt nl  Chest:some basilar rhonchi clears some post neb and after numerous coughing paroxsyms   COR: RRR no m,r,g. No JVD or edema  neuro normal   CXR patchy bronchial infiltrate  PF 200-250   after neb 250  A/P:  1.786.2 COUGH (primary encounter diagnosis)  2. 490 BRONCHITIS NOS    croup possible bronchial pneumonia   discussed etiology and usual course. Reviewed warning signs.  may try mist/shower or bundle up and take the pt outside.  reviewed signs of respiratory distress and discussed when to seek care.  Current outpatient prescriptions:  ALBUTEROL 90 MCG/ACT IN AERS Inhale 2 puffs every 4 to 6 hours as needed,,,  ROBITUSSIN A-C 10-100 MG/5ML OR SYRP Take 1 to 2 teaspoonfuls every 4 to 6 hours as needed for cough,,,  AZITHROMYCIN 250 MG OR TABS Take 2 tablets today, then take 1 tablet every day until gone,,,  PREDNISONE 20 MG OR TABS 2 TABLETs DAILY X 3 days then 1 po q day X 3 days then 1/2 tab po q day X 2 days ,,,    reviewed warning signs seek care if worse

## 2002-11-05 NOTE — Nursing Note (Signed)
>>   Connie [name], Connie [name]           11/05/2002 9:16 am  Started on Sunday 100.7, low grade fever, Cough at the begining was really painful prior to the sinus congestion, last nite having [name] really hard time breathing shallow breaths, really wheezy.  Green phlegm.    [facility] Flow 225

## 2002-11-05 NOTE — Telephone Encounter (Signed)
>>   KIM A HOLLEY Mon Nov 10, 2002 3:54 PM  Pt scheduled appt to come in [place].    >> SUMAM Scharlene Gloss Nov 10, 2002 3:38 PM  I would not extend the prednisone as she is still on it. Advise her to increase fluids and RTC if not improving in two to three more days.    >> STEPHANIE A HARGROVE Mon Nov 10, 2002 2:40 PM  Still coughing up junk all the time, pretty much done with prednisone, still having hard time with the bronchial stuff, ST gone, and done with the azithromycin. Dr. Darin Engels do you want to extend the prednisone for several more days? Pls advise.    >> Antionette Fairy [facility] Mon Nov 10, 2002 2:13 PM  Clr states that pt says her throat is better but her lungs are not. Clr requesting a call on how to proceed. Clr is wondering if this is something pt needs to wait out, or if there's something she should be doing.    >> [name] Fri Nov 07, 2002 8:19 AM  Seri is feeling better and feels is responding to the antibiotics.  Hopes to hear today regarding son's diagnosis and will call back to let us know.    >> Imagene Gurney Nov 06, 2002 6:39 PM  call and see what the results were   I have screened several people with this constellation of symptoms and all were neg for pertussis.      >> Farrel Gordon [facility] Wed Nov 05, 2002 2:42 PM  FYI Dr. Alvino Chapel.    >> [facility] Wed Nov 05, 2002 2:13 PM  Pt of [name], MD.    GENERAL MESSAGE  ------------------    Main reason for the call: Pt states son went to doctor and may have whooping cough instead of croup. Pt would like Dr Allie Dimmer to know in case this changes his diagnosis. Pt states test results will be back in a couple days to verify son's diagnosis.  Preferred phone number: [phone] (home)   Call back expected: YES  Okay to leave a VM or msg with someone? YES

## 2002-11-06 ENCOUNTER — Ambulatory Visit (INDEPENDENT_AMBULATORY_CARE_PROVIDER_SITE_OTHER): Payer: Self-pay | Admitting: Internal Medicine

## 2002-11-12 ENCOUNTER — Ambulatory Visit (INDEPENDENT_AMBULATORY_CARE_PROVIDER_SITE_OTHER): Payer: PPO | Admitting: Family Medicine

## 2002-11-12 VITALS — BP 100/80 | HR 76 | Temp 99.3°F | Resp 24 | Wt 173.0 lb

## 2002-11-12 MED ORDER — FLOVENT 110 MCG/ACT IN AERO
INHALATION_SPRAY | RESPIRATORY_TRACT | Status: DC
Start: 2002-11-12 — End: 2003-12-08

## 2002-11-12 MED ORDER — AUGMENTIN 500-125 MG OR TABS
ORAL_TABLET | ORAL | Status: DC
Start: 2002-11-12 — End: 2002-11-21

## 2002-11-12 NOTE — Nursing Note (Signed)
>>   Connie [name], Connie [name]           11/12/2002 9:54 am  Cough still not much better, has improved [name] tiny bit in the last 2 days, coughing alot of phelgm color green.

## 2002-11-12 NOTE — Progress Notes (Signed)
Connie [name] is a 41 year old female established patient who presents concerned about  1)   Still coughing up green productive sputum   She may be slightly better   No fevers at home Low grade fever here.   headaches in the frontal sinus area with some post nasal drip   facial pressure and sore teeth  Sleeping better and able to sleep thru the night without the severe coughing paroxsyms         Review of patient's allergies indicates:   No Known Drug *     Current outpatient prescriptions:  ALBUTEROL 90 MCG/ACT IN AERS Inhale 2 puffs every 4 to 6 hours TYLENOL/CODEINE #3 300-30 MG OR TABS 1-2 PO Q 4-6 For Cough,,,  NUVARING 0.12-0.015 MG/24HR VA RING weekly,,,     Tobacco Use: Quit Packs/Day: .5 Years: 49    Quit date: 08/27/1989   Alcohol Use: Yes    Comment: 1-2 every coulpe of weeks   ROS no rash  No N/V/D   No shortness of breath no chest pain   no nightsweats   no travel   no dizziness, palpitations or fainting.   Good po intake and urine output  OBJECTIVE:   comfortable appearing female in NAD   BP 100/80   Pulse 76   Temp 99.3   Temp Src: Oral   Resp 24   Wt 173 lbs (78.472 kg)   Skin:well perfused ,no rash.  Heent: NCAT, OP normal , no ant and post cervical adenopathy , neck is supple . No thyromegaly  nasal passages swollen boggy and with rhinorrhea and post nasal drip Scant colored D/c.and tenderness to palp on the L maxillary area  TMs Rt nl Lt nl  Chest:rhonchi Rt base clears with cough PF 250without much coughing paroxsyms  COR: RRR no m,r,g. No JVD or edema  Abd: +BS, soft NT no organomegaly     A/P:  1.   490 BRONCHITIS NOS (primary encounter diagnosis)  possible sinusitis   treat with augmentin  cont albuterol and add in inhaled steroid instead of repeating oral prednisone   reviewed warning signs seek care if worse or not resolved by the end of treatment   Plan: FLOVENT 110 MCG/ACT IN AERO, AUGMENTIN 500-125    MG OR TABS, CULTURE:SPUTUM/LOWER RESP, SPECIMEN   HANDLING FEE DR->LAB

## 2002-11-15 ENCOUNTER — Telehealth (INDEPENDENT_AMBULATORY_CARE_PROVIDER_SITE_OTHER): Payer: Self-pay | Admitting: Internal Medicine

## 2002-11-15 LAB — LOWER RESP C/S W/GRAM

## 2002-11-15 NOTE — Telephone Encounter (Signed)
>>   LINDA A WEGSTEEN Mon Nov 17, 2002 8:47 AM  Pt. requesting refill.  1-Name of requested medication: robitussin A-C  2-Class of medication requested: Cough Medication  3-Date last refilled: 11/05/02  4-Date of last office visit: 11/12/02  5-Next office visit (scheduled or expected): none scheduled.          >> Griffin Basil OZH Nov 15, 2002 2:16 PM  Pt of [name], MD.  REFILL CALL      Medication(s) requested: cough syrup with codeine "beginning with GUA"  Dose taking now: pt unsure  Reason for request: refill, almost out  Written prescription needed? NO  Pharmacy name and location: [facility]   Pharmacy phone #: [phone]  Preferred phone #: [phone] (home)   Okay leave VM or msg with someone? YES    TSR - Please inform patient that refills typically take 1-2 business days and to call their pharmacy to verify status. Patient has been informed of the 1-2 business day refill rule.

## 2002-11-17 MED ORDER — ROBITUSSIN A-C 10-100 MG/5ML OR SYRP
ORAL_SOLUTION | ORAL | Status: DC
Start: 2002-11-17 — End: 2003-03-11

## 2002-11-18 ENCOUNTER — Telehealth (INDEPENDENT_AMBULATORY_CARE_PROVIDER_SITE_OTHER): Payer: Self-pay | Admitting: Family Medicine

## 2002-11-18 ENCOUNTER — Telehealth (INDEPENDENT_AMBULATORY_CARE_PROVIDER_SITE_OTHER): Payer: Self-pay | Admitting: Internal Medicine

## 2002-11-18 NOTE — Telephone Encounter (Signed)
>>   Connie [name] Tue Nov 18, 2002 2:35 PM  pt had fax sent on 12/22 will call if not available

## 2002-11-18 NOTE — Telephone Encounter (Signed)
>>   Connie [name] Tue Nov 18, 2002 12:20 PM  advised Connie [name] to have her hang tight and use med prescribed and if worse seek care   He will see me this afternoon as they area all ill     >> KIM A HOLLEY Tue Nov 18, 2002 10:52 AM  SPoke with pt andshe states that her sx are slowly decreasing, but still has the HA, fatigue, and SOB, with occasional nausea (which pt was concerned this could be caused by the Augmentin). Pls advise on next step for pt, she wants to know if she needs to come in or wait it out.    >> Wanita Chamberlain Tue Nov 18, 2002 9:44 AM  Pt of [name], MD.    TRIAGE/ADVICE  Pt states she is still coughing, and still has tightness in her chest.   -----------  Main reason for calling is: Pt would like to discuss her health condition with a medical staff person   How long has this episode lasted: Two weeks   Preferred phone number: [phone] (home)   Okay to leave message with someone? YES

## 2003-03-11 ENCOUNTER — Ambulatory Visit (INDEPENDENT_AMBULATORY_CARE_PROVIDER_SITE_OTHER): Payer: PPO | Admitting: Family Medicine

## 2003-03-11 VITALS — BP 110/80 | HR 80 | Temp 100.9°F | Resp 20 | Wt 186.0 lb

## 2003-03-11 MED ORDER — ZITHROMAX 250 MG OR TABS
ORAL_TABLET | ORAL | Status: DC
Start: 2003-03-11 — End: 2003-03-16

## 2003-03-11 MED ORDER — ALBUTEROL 90 MCG/ACT IN AERS
INHALATION_SPRAY | RESPIRATORY_TRACT | Status: DC
Start: 2003-03-11 — End: 2003-12-08

## 2003-03-11 NOTE — Progress Notes (Signed)
SUBJECTIVE:  Patient presents with laryngitis and allergy symptoms for the past 10 days including nasal congestion, clear rhinorrhea, and newly productive cough. No fever. No vomiting or diarrhea. Taking food and fluids well.     Review of patient's allergies indicates:   No Known Drug *    Current outpatient prescriptions:  ALBUTEROL 90 MCG/ACT IN AERS Inhale 2 puffs every 4 to 6 hours as needed,,,  ZITHROMAX 250 MG OR TABS Take 2 tablets today, then take 1 tablet every day until gone,,,  FLOVENT 110 MCG/ACT IN AERO Inhale 1 puff by mouth twice daily regardless of symptoms,,,  NUVARING 0.12-0.015 MG/24HR VA RING weekly,,,        Tobacco Use: Quit Packs/Day: .5 Years: 55    Quit date: 08/27/1989   Alcohol Use: Yes    Comment: 1-2 every couple of weeks   ROS no travel no rash no N/V/D   no chest pain      OBJECTIVE  comfortable appearing female in NAD   BP 110/80   Pulse 80   Temp 100.9   Temp Src: Oral   Resp 20   Wt 186 lbs (84.369 kg)   LMP 02/12/2003   PF 320L/min   Alert, well hydrated. no rash TM's nl. Clear rhinorrhea. Oropharynx shows mild erythema no exudate . Neck supple without significant adenopathy. Lungs initial wheezing and some coughing paroxsyms   after albuterol NEB PF went up to 375 from 320    Abdomen soft   no organomegaly .    ASSESSMENT:      Acute pharyngitis with new productive sputum   Rapid strep was not done      786.2 COUGH  Plan: ALBUTEROL 90 MCG/ACT IN AERS       490 BRONCHITIS NOS  Plan: ALBUTEROL 90 MCG/ACT IN AERS           PLAN:  1) Symptomatic treatment with fluids, humidification, acetaminophen.  2) Recheck if symptoms persist, worsen, or new symptoms develop.

## 2003-03-11 NOTE — Nursing Note (Signed)
>>   Connie [name], Connie [name]           03/11/2003 11:47 am  ST, coughing, phelgm in lungs is green, no fever noted, sinus congestion, loss of voice, started 10 days ago, tried benadryl no help and then started claritin for 5 days has helped with the sneezing, but not the cough or ST.

## 2003-03-16 ENCOUNTER — Telehealth (INDEPENDENT_AMBULATORY_CARE_PROVIDER_SITE_OTHER): Payer: Self-pay | Admitting: Internal Medicine

## 2003-03-16 NOTE — Telephone Encounter (Addendum)
Addended by: Crissie Reese on: 03/18/2003 12:13:56 PM     Comment: I spoke with Olegario Messier and advised augmentin f/u if this is not improvign symptoms    >> [name] Wed Mar 18, 2003 11:30 AM  Discussed with patient and Rx faxed to pharmacy.    >> MARK J HOBBS Wed Mar 18, 2003 11:20 AM  Pt calling back, would like Rx sent to [facility], [phone]. Pt may be reached at [phone] (home) and req call back to advise.     >> KATHY J GIBBS Wed Mar 18, 2003 11:01 AM  Still productive green phlegm all day, still lots of nasal congestion and drainage. No fever. Not really SOB but feels at time not getting enough oxygen. Not wheezing.  Taking claritan.  Overall just not feeling better (not feeling worse)  a. uri  p. Discussed with Dr Lance Coon give 10day course of Augmentin.  left message to call--need to find out which pharmacy.    >> GRETCHEN L COOK Wed Mar 18, 2003 10:33 AM  Pt called back and restated below request/concern. There was no ans. at [facility] extn. Front desk advised to send TE urgent as pt has been waiting for a c/b for two days. Pt may be reached at [phone] (home) discussion and msg with Gery Pray her husband ok     >> Pam Drown Tue Mar 17, 2003 1:58 PM  Pt is calling [facility]. Pt states she finished antibiotics on Sunday 03/16/03 and she is not better. Please call at [phone] (home) or [phone] (cell).    >> Pascal Lux Mar 16, 2003 5:31 PM  Please advise on changing ABX.    >> Willette Alma Mon Mar 16, 2003 2:27 PM  Dr Kelle Darting pt  TRIAGE/ADVICE  -----------  Main reason for calling is: Pt states that she was seen by Dr Allie Dimmer for a URI, and that the ABX that were prescribed are not working. Pt is requesting a c/b from the provider's office for advice.  How long has this episode lasted: n/a  Preferred phone number: [phone] (home)   Okay to leave VM or msg with someone? YES

## 2003-03-18 MED ORDER — AUGMENTIN 500-125 MG OR TABS
ORAL_TABLET | ORAL | Status: DC
Start: 2003-03-18 — End: 2003-03-27

## 2003-03-30 ENCOUNTER — Telehealth (INDEPENDENT_AMBULATORY_CARE_PROVIDER_SITE_OTHER): Payer: Self-pay | Admitting: Family Medicine

## 2003-03-30 NOTE — Telephone Encounter (Signed)
>>   Connie [name] Mar 30, 2003 3:16 PM  future ordered labs   she will do the [employer]

## 2003-12-08 ENCOUNTER — Ambulatory Visit (INDEPENDENT_AMBULATORY_CARE_PROVIDER_SITE_OTHER): Payer: PPO | Admitting: Internal Medicine

## 2003-12-08 MED ORDER — AZITHROMYCIN 250 MG OR CAPS
ORAL_CAPSULE | ORAL | Status: DC
Start: 2003-12-08 — End: 2003-12-13

## 2003-12-08 MED ORDER — ROBITUSSIN A-C 10-100 MG/5ML OR SYRP
ORAL_SOLUTION | ORAL | Status: DC
Start: 2003-12-08 — End: 2003-12-13

## 2003-12-08 MED ORDER — ALBUTEROL 90 MCG/ACT IN AERS
INHALATION_SPRAY | RESPIRATORY_TRACT | Status: DC
Start: 2003-12-08 — End: 2004-04-01

## 2003-12-08 NOTE — Progress Notes (Signed)
This patient, Connie [name] is a 43 year old year old female who presents with the following:    465.9 ACUTE URI NOS  She complains of chest cold for one week, improving. Nonproductve cough, congestions. She denies fever. She has had headache She denies sore throat. She denies sob.    372.30 CONJUNCTIVITIS NOS  She complains of os lateral irritaiton x one week and now redness. Some light sensitivitiy.  She denies discharge. She denies visual changes.     PE: Well developed female in no acute distress  BP 118/78   Pulse 72   Temp 98.9   Temp Src: Oral   Resp 12   Ht 5\' 1"  (1.527m)   Wt 188 lbs (85.276 kg)  External ears and canals clear bilaterally. TM's normal bilaterally.   Nose normal without lesions or discharge.   Oropharynx normal. Neck supple without palpable adenopathy.  Chest mild diffuse wheezes , no rales.  os 20/40  od 20/100  PERRLA, EOM's intact, conjunctivae clear. OS lateral sclearl injection noted. Fundiscopic exam is negative. Sharp disc margins visualized.    A/P  465.9 ACUTE URI NOS/bronchitis, rad  372.30 CONJUNCTIVITIS NOS  Note: likley sclearl irritation from trauma  Plan:   ALBUTEROL 90 MCG/ACT IN AERS Inhale 2 puffs every 4 to 6 hours as needed,,,  AZITHROMYCIN 250 MG OR CAPS Take 2 capsules today, then take 1 capsule every day until gone,,, if not improving in a few days  ROBITUSSIN A-C 10-100 MG/5ML OR SYRP Take 1 to 2 teaspoonfuls every 4 to 6 hours as needed for cough,,,        follow up if not resolved.

## 2003-12-08 NOTE — Nursing Note (Signed)
>>   Connie [name], Connie [name]                    12/08/2003 3:45 pm  Pt denies taking any medications at this time.  Pt c/o chest congestion, coughing, HA, body aches, and fatigue x 1 week.

## 2004-02-05 ENCOUNTER — Ambulatory Visit (INDEPENDENT_AMBULATORY_CARE_PROVIDER_SITE_OTHER): Payer: PPO | Admitting: Internal Medicine

## 2004-02-05 MED ORDER — AEROCHAMBER MV MISC
Status: DC
Start: 2004-02-05 — End: 2004-04-01

## 2004-02-05 MED ORDER — FLUTICASONE PROPIONATE (INHAL) 110 MCG/ACT IN AERO
INHALATION_SPRAY | RESPIRATORY_TRACT | Status: DC
Start: 2004-02-05 — End: 2004-04-01

## 2004-02-05 MED ORDER — ALBUTEROL 90 MCG/ACT IN AERS
INHALATION_SPRAY | RESPIRATORY_TRACT | Status: DC
Start: 2004-02-05 — End: 2004-04-01

## 2004-02-05 NOTE — Nursing Note (Signed)
>>   [place], Connie [name]                 02/05/2004 3:39 pm  pt c/o laryngitis and cough x 4 days.

## 2004-02-05 NOTE — Progress Notes (Signed)
02/05/2004  3:50 PM  Chart Reviewed.  Chief Complaint: 43 year old female presents with the following issues on which she wishes to focus attention:  URI    SUBJECTIVE:  1) URI: Had a cold last wk with myalgia and fatigue. She didn't have much congestion initially. There was a mild cough. 3 d ago developed laryngitis and lost her voice. She has a deep cough. Mild ST now, comes in with low grade fever. Other than occassional HA has no real pain. She has a hx of getting bronchitis in her. She denies having asthma but has used albuterol in the past and thinks it helps. She comes in thinking that antibiotics might be necessary.     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]   H/O SPON ABORT x 2[634.90]     PMHx: Review of patient's past medical history indicates:   NO PAST MEDICAL HISTORY     Medications: Current outpatient prescriptions:  ALBUTEROL 90 MCG/ACT IN AERS Inhale 2 puffs every 4 to 6 hours as needed,,,    Review of patient's family history indicates:   Heart Father    Lipids Mother    Other Father    Comment: Crohns      Health Maintenance Issues:    Date Due Procedure   09/13/2002 - PAP SMEAR   01/01/2012 - TETANUS BOOSTER    SocHx: Social History   Marital Status: Married Spouse Name:    Years of Education: Number of children:     Social History Main Topics   Tobacco Use: Quit Packs/Day: .5 Years: 10    Quit date: 08/27/1989   Alcohol Use: Yes    Comment: 1-2 every coulpe of weeks   Drug Use: No    Sexually  Active: Yes Partners with: Female   Comment: mutually mongamous since 1987    Other Topics Concern   None on file    Social History Narrative   Mom for 1 child, husband is a Administrator. Has lived in [place] since 1993.         OBJECTIVE:  VS*: BP 122/86   Pulse 88   Temp 99.4   Temp Src: Oral   Wt 189 lbs (85.730 kg)  CONSTITUTIONAL/GENERAL: Appearance: Well developed, appearing stated age and in no acute distress, Facial features:    External ears and nose are normal in appearance without significant scars or asymmetry., Weight appears normal for height, Gait and station: Normal.  Judgement/insight: Normal, Mood/affect: Normal.  EARS/NOSE/THROAT: Otoscopic exam: Canals are clear. TMs show normal landmarks without injection or fluid behind the membranes., Hearing: Normal as tested by [finger rub, tuning fork, whispered voice]. [, Nose: Normal nasal mucosa, septum and turbinates., Mouth: Lips, gums, tongue and oral mucosa are normal. Teeth appear healthy., Pharynx: Oropharynx shows normal tonsils and adenoids. No post-nasal drainage. Marland Kitchen  NECK: Inspection: Normal alignment; no masses., Thyroid: No enlargement, masses or tenderness., Cervial nodes: No adenopathy.  RESPIRATORY: Inspection: Normal respiratory effort and chest wall movement with respiration., Palpation: Deferred, Percussion: No areas of dullness or hyper-resonance. There is normal diaphragmatic movement with respiration., Auscultation: Significant findings include expiratory wheezing.    ASSESSMENT/PLAN:  465.8 ACUTE URI MULT SITES NEC  493.92 UNSPECIFIED ASTHMA, WITH ACUTE EXACERBATION  Note: mild intermittent related to viral  Plan: ALBUTEROL 90 MCG/ACT IN AERS, AEROCHAMBER MV    MISC, FLUTICASONE PROPIONATE (INHAL) 110    MCG/ACT IN AERO     MEDICAL DECISION MAKING:  # of diagnoses or management options: multiple (c)  Amount and/or complexity of data to be reviewed: moderate (c)  Risk of complications and/or morbidity or mortality: moderate  (c)    F/U: Call or return to clinic prn if these symptoms worsen or fail to improve as anticipated.     Patient Ed: Discussed in detail all aspects of the patient's care described above. We agreed to discuss at a future appointment any topics for which there was insufficient time at todays meeting.    Note to reviewer. The content of this evaluation includes items in the medical record such as allergies and aspects of past history items recorded in the chart, which may not have been specifically entered again in this particular entry.

## 2004-02-08 ENCOUNTER — Ambulatory Visit (INDEPENDENT_AMBULATORY_CARE_PROVIDER_SITE_OTHER): Payer: Self-pay | Admitting: Internal Medicine

## 2004-04-01 ENCOUNTER — Ambulatory Visit (INDEPENDENT_AMBULATORY_CARE_PROVIDER_SITE_OTHER): Payer: PPO | Admitting: Internal Medicine

## 2004-04-01 VITALS — BP 112/80 | HR 82 | Temp 98.6°F | Resp 20 | Wt 187.0 lb

## 2004-04-01 MED ORDER — TRAZODONE HCL 50 MG OR TABS
ORAL_TABLET | ORAL | Status: DC
Start: 2004-04-01 — End: 2004-05-24

## 2004-04-01 NOTE — Nursing Note (Signed)
>>   Connie [name], Connie [name]     04/01/2004   12:51 pm  meds current, here for sleeping concerns/problems.

## 2004-04-01 NOTE — Patient Instructions (Signed)
Try melatonin to help with sleep. This is available as 1-3 mg pills over the counter. You will need to take 3-6 mg to sleep.

## 2004-04-01 NOTE — Progress Notes (Signed)
This patient, Connie [name] is a 43 year old year old female who presents with the following:    780.52 INSOMNIA NEC (primary encounter diagnosis)  She complains of difficulty attaining and mainting sleep. She denies stress or anxiety or mood disorder    PE: WD female in NAD, alert and cooperative  BP 112/80   Pulse 82   Temp (Src) 98.6 (Oral)   Resp 20   Wt 187 lbs (84.8kg)  dressed and groomed appropriately    A/P  780.52 INSOMNIA NEC (primary encounter diagnosis)  Note: reviewed sleep hygein issues  Plan: TRAZODONE HCL 50 MG OR TABS   See patient instructions. follow up if not resolved.    Time spent for this visit was 15 min, greater than 50% of which was face to face couselling on 780.52 INSOMNIA NEC (primary encounter diagnosis).

## 2004-05-13 ENCOUNTER — Telehealth (INDEPENDENT_AMBULATORY_CARE_PROVIDER_SITE_OTHER): Payer: Self-pay | Admitting: Internal Medicine

## 2004-05-13 NOTE — Telephone Encounter (Signed)
>>   MASON B DEBATO Fri May 13, 2004 11:35 AM  advised below.    >> [name] [facility] Fri May 13, 2004 10:30 AM  If her symptoms are not alleviated by albuterol, it is very important that she is seen in an acute care clinic in [place] today. I will not be ordering additional medications without an appropriate evaluation.     >> MASON B DEBATO Fri May 13, 2004 9:50 AM  pt c/b stating someone call her from clinic, pt is requesting a response ASAP.    >> Marlene Bast B DEBATO Fri May 13, 2004 9:05 AM  lov 04/01/04, nov none sched, will forward to pcp for review.    >> [name] Fri May 13, 2004 8:28 AM  TRIAGE/ADVICE  -----------  PCP: [name], MD, MD  Main reason for calling is: Pt stated she is in [place] and cannot come in for an appt, that she has had allergy sx that have gone into her lungs and that she has a lot of muccous. Pt stated she has albuterol that has worked a little but not much and has been taking Nyquil at night. Pt stated this has happened before and she typically needs an antibiotic to resolve symptoms. Pt requested a c/b to advise. If needed, the pharmacy to use in [place] is [facility] at [phone]  How long has this episode lasted: about 7 - 10 days  Preferred phone number: [phone] (cell) or [phone] ([facility], pt's in-laws)  Molli Knock to leave VM or msg with someone? YES

## 2004-05-24 ENCOUNTER — Ambulatory Visit (INDEPENDENT_AMBULATORY_CARE_PROVIDER_SITE_OTHER): Payer: PPO | Admitting: Family Medicine

## 2004-05-24 ENCOUNTER — Encounter (INDEPENDENT_AMBULATORY_CARE_PROVIDER_SITE_OTHER): Payer: Self-pay | Admitting: Family Medicine

## 2004-05-24 VITALS — BP 130/80 | HR 100 | Temp 98.9°F | Resp 20 | Wt 185.0 lb

## 2004-05-24 MED ORDER — AMOXICILLIN 500 MG OR CAPS
ORAL_CAPSULE | ORAL | Status: AC
Start: 2004-05-24 — End: 2004-06-02

## 2004-05-24 NOTE — Nursing Note (Signed)
>>   HARGROVE, STEPHANIE A     05/24/2004   10:44 am  chest congestion, x 2 weeks, and 1 week ago Friday took abx zpack, seemed to improved but still lingering on and almost coming back again.    cough, pheglm is starting to become green again for 3 days, SOB, no fever, no other symptom.

## 2004-05-24 NOTE — Progress Notes (Signed)
SUBJECTIVE:  Patient presents with allergy symptoms for the past 14 days including nasal congestion, rhinorrhea, and non-productive cough.   She was in [place] and she noted worsing bronchial  and colored sputum and took zithromax   she got better and is now noting increased green productive sputum   fever. No vomiting or diarrhea.   Taking food and fluids well.   She finished zithromax   No current outpatient prescriptions on file.   NKDA   albuterol MDI     No tobacco     ROS No wheezing no RAD   no shortness of breath   no abd pain   no rash   OBJECTIVE: BP 130/80   Pulse 100   Temp (Src) 98.9 (Oral)   Resp 20   Wt 185 lbs (83.9kg)   PF 300L/min   LMP 05/14/2004   Alert, well hydrated. No rash TM's nl. Clear rhinorrhea. Oropharynx shows mild erythema no exudate . Neck supple without significant adenopathy. Lungs clear.coughing paroxsyms dry nonproductive    Cor RRR no m  Abdomen soft no organomegaly.  neuro alert interactive   ASSESSMENT:       786.2 COUGH (primary encounter diagnosis)  use albuterol MDI, resume flonase and if productive sputum worsens fill AMOX   reviewed warning signs seek care if worse     477.9 ALLERGIC RHINITIS NOS  Note: as above   Plan: claritin   doesn't tolerate sudafed            PLAN:  1) Symptomatic treatment with fluids, humidification, acetaminophen.  2) Recheck if symptoms persist, worsen, or new symptoms develop.

## 2004-08-18 ENCOUNTER — Ambulatory Visit (INDEPENDENT_AMBULATORY_CARE_PROVIDER_SITE_OTHER): Payer: PPO | Admitting: Internal Medicine

## 2004-08-18 VITALS — BP 108/70 | HR 80 | Resp 12 | Wt 186.0 lb

## 2004-08-18 LAB — CBC, DIFF
% Basophils: 1 %
% Eosinophils: 2 %
% Lymphocytes: 27 %
% Monocytes: 6 % (ref 0–19)
% Neutrophils: 64 %
Absolute Eosinophil Count: 0.14 10*3/uL (ref 0–0.50)
Absolute Lymphocyte Count: 2.01 10*3/uL (ref 1.00–4.80)
Basophils: 0.06 10*3/uL (ref 0–0.20)
Hematocrit: 39 % (ref 36–45)
Hemoglobin: 13.3 g/dL (ref 11.5–15.5)
MCH: 29.6 pg (ref 27.3–33.6)
MCHC: 33.8 g/dL (ref 32.3–35.7)
MCV: 88 fL (ref 81–98)
Monocytes: 0.41 10*3/uL (ref 0–0.80)
Neutrophils: 4.78 10*3/uL (ref 1.80–7.00)
Platelet Count: 226 10*3/uL (ref 150–400)
RBC: 4.5 mil/uL (ref 3.80–5.00)
WBC: 7.41 10*3/uL (ref 4.3–10.0)

## 2004-08-18 LAB — CHOLESTEROL (TOTAL & HDL)
Cholesterol/HDL Ratio: 5.6
HDL Cholesterol: 36 mg/dL — ABNORMAL LOW (ref >40)
Total Cholesterol: 200 mg/dL — ABNORMAL HIGH (ref <200)

## 2004-08-18 LAB — IRON BINDING CAPACITY (W/IRON, TRANSFERRIN & TRANSF SAT)
Iron, SRM: 59 ug/dL (ref 55–155)
Total Iron Binding Capacity: 361 ug/dL (ref 270–400)
Transferrin Saturation: 16 % (ref 15–50)

## 2004-08-18 MED ORDER — AMBIEN 10 MG OR TABS
ORAL_TABLET | ORAL | Status: DC
Start: 2004-08-18 — End: 2005-01-19

## 2004-08-18 NOTE — Progress Notes (Signed)
This patient, Connie [name] is a 43 year old year old female who presents with the following:    780.52 INSOMNIA NEC  She had slept well this past summer. She had adverse effects with trazadone and did not continue that medication. She will travel to [place] in a few weeks and requests Remus Loffler for this as well for occaisional use.    705.83 HIDRADENITIS  She complains of having had red lump at L ax, now resolving.    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    704.00 ALOPECIA NOS  She has had more hare falling out x months. She denies new products.        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

## 2004-08-19 LAB — FERRITIN: Ferritin: 32 ug/L (ref 15–160)

## 2004-08-19 LAB — RPR

## 2004-08-22 LAB — ANA REFLX PNL W/ANTI CENTRM AB

## 2004-08-24 LAB — ANA IDENTIFICATION PANEL: Anti dsDNA (EIA): 0 U/mL (ref 0–14)

## 2004-08-26 LAB — UNIDENTIFIED ANTIBODY

## 2004-09-23 ENCOUNTER — Ambulatory Visit (INDEPENDENT_AMBULATORY_CARE_PROVIDER_SITE_OTHER): Payer: PPO | Admitting: Internal Medicine

## 2004-09-23 VITALS — BP 110/82 | HR 78 | Temp 100.1°F | Resp 14

## 2004-09-23 MED ORDER — ROBITUSSIN A-C 10-100 MG/5ML OR SYRP
ORAL_SOLUTION | ORAL | Status: DC
Start: 2004-09-23 — End: 2005-03-31

## 2004-09-23 MED ORDER — ALBUTEROL 90 MCG/ACT IN AERS
INHALATION_SPRAY | RESPIRATORY_TRACT | Status: DC
Start: 2004-09-23 — End: 2006-04-03

## 2004-09-23 MED ORDER — AZITHROMYCIN 250 MG OR CAPS
ORAL_CAPSULE | ORAL | Status: DC
Start: 2004-09-23 — End: 2005-03-31

## 2004-09-23 NOTE — Progress Notes (Signed)
Connie [name] is a 43 year old female Patient presents with:    URI    History of the Present Illness (1-3 elements):pt of Dr Festus Aloe. Has had a URI for 2 wks in [place], returned one week ago and it has now gone to her chest. Feels slightly sob. Cough is productive of greenish sputum. Has been using albuterol but she not sure if anything is coming out of the MDI. denies fever.    Review of Systems :The following systems were reviewed   and were diffusely negative:constitutional, ears/nose/mouth/throat, respiratory and allergic/immunologic.  Review of systems revealed the following positives not discussed in the ZOX:WRUE    Review of patient's past medical history indicates:   NO PAST MEDICAL HISTORY     Review of patient's past surgical history indicates:   NO PAST SURGICAL HISTORY     Patient Active Problem List:    PROCREATIVE MGMT-COUNSEL[V26.4]   H/O SPON ABORT x 2[634.90]   ALLERGIC RHINITIS NOS[477.9]   UNSP ASTHMA W/O STATUS ASTHMATICUS[493.90]    Current Medications:  No hospital prescriptions on file as of 09/23/04.  Outpatient prescriptions as of 09/23/04:   ALBUTEROL 90 MCG/ACT IN AERS, Inhale 2 puffs every 4 to 6 hours as needed, Disp: 1 inhaler, Rfl: 2; AZITHROMYCIN 250 MG OR CAPS, Take 2 capsules today, then take 1 capsule every day until gone, Disp: 6, Rfl: 0; ROBITUSSIN A-C 10-100 MG/5ML OR SYRP, Take 1 to 2 teaspoonfuls every 4 to 6 hours as needed for cough, Disp: , Rfl: 0;       Allergies:  No Known Drug Allergies    Exam: healthy, alert, no distress, relaxed, cooperative  BP 110/82   Pulse 78   Temp (Src) 100.1 (Oral)   Resp 14   SaO2 98%   PF 400L/min  HEENT: Pupils equal, reactive to light and accomodation. EOM's are full. No end point nystagmus. Sclerae and conjunctivae clear. External ears appear normal, there is no pain on traction of the helix or compression of the tragus. Ear canals are clear. TM's normal bilaterally. Nose normal without lesions or discharge.Gums and dentition  normal. No sinus tenderness. Oropharynx normal. Neck supple without palpable adenopathy. No thyroid enlargement or discrete nodule.  LUNGS:diffuse wheezing, moves air well. scattered ronchi    IMPRESSION/PLAN:  786.2 COUGH (primary encounter diagnosis)  Note: bronchitis with RAD  Plan: Z pack, albuterol, flovent    465.8 ACUTE URI MULT SITES NEC  Note:   Plan:     786.09 RESPIRATORY ABNORM NEC  Note:   Plan:     465.9 ACUTE URI NOS  Note:   Plan: ALBUTEROL 90 MCG/ACT IN AERS, AZITHROMYCIN 250    MG OR CAPS, ROBITUSSIN A-C 10-100 MG/5ML OR    SYRP       493.90 UNSP ASTHMA W/O STATUS ASTHMATICUS  Note:   Plan:       Follow-up:prn

## 2004-09-23 NOTE — Nursing Note (Signed)
>>   [name], Connie C     09/23/2004   4:37 pm  Pt c/o ongoing uri, cough, congestion x3-4weeks.

## 2005-01-19 ENCOUNTER — Telehealth (INDEPENDENT_AMBULATORY_CARE_PROVIDER_SITE_OTHER): Payer: Self-pay | Admitting: Internal Medicine

## 2005-01-19 MED ORDER — AMBIEN 10 MG OR TABS
ORAL_TABLET | ORAL | Status: DC
Start: 2005-01-19 — End: 2005-07-20

## 2005-01-19 NOTE — Telephone Encounter (Signed)
>>   Connie [name] Thu Jan 19, 2005 11:10 AM  Pt.was notified.    >> [name] FERRUCCI Thu Jan 19, 2005 10:57 AM  rf approved.     >> [name] Thu Jan 19, 2005 10:04 AM  Please refill or advise, no future appt.scheduled.    >> [facility] [name] Thu Jan 19, 2005 9:49 AM  REFILL CALL  PCP: [name], MD, MD  Prescribing Provider: [name], MD, MD  Medication(s) requested:Ambien  Dose taking now: 10mg   Reason for request: refill  Written prescription needed? NO  To Pharmacy Name: [facility]  Pharmacy location:[place] [facility]  Pharmacy phone #:[phone]  Preferred phone #: [phone]  Okay leave VM or msg with someone?YES    TSR - Please inform patient that refills typically take 1-2 business days and to call their pharmacy to verify status. Done

## 2005-01-26 ENCOUNTER — Ambulatory Visit: Payer: Self-pay | Admitting: Family Medicine

## 2005-03-17 ENCOUNTER — Ambulatory Visit (INDEPENDENT_AMBULATORY_CARE_PROVIDER_SITE_OTHER): Payer: Self-pay | Admitting: Internal Medicine

## 2005-03-31 ENCOUNTER — Ambulatory Visit (INDEPENDENT_AMBULATORY_CARE_PROVIDER_SITE_OTHER): Payer: PPO | Admitting: Internal Medicine

## 2005-03-31 VITALS — BP 118/72 | HR 74 | Temp 97.2°F | Resp 12 | Wt 174.0 lb

## 2005-03-31 MED ORDER — FLONASE INHA 50 MCG/DOSE NA
NASAL | Status: DC
Start: 2005-03-31 — End: 2007-05-13

## 2005-03-31 MED ORDER — ZYRTEC 10 MG OR TABS
ORAL_TABLET | ORAL | Status: DC
Start: 2005-03-31 — End: 2006-01-04

## 2005-03-31 MED ORDER — VOSOL HC 2-1 % OT SOLN
OTIC | Status: DC
Start: 2005-03-31 — End: 2007-05-13

## 2005-03-31 NOTE — Progress Notes (Signed)
This patient, LAQUISHA NORTHCRAFT is a 44 year old year old female who presents with the following:    389.9 HEARING LOSS NOS  She complains of hearing loss over a few years. She denies history of noise exposure.     380.22 ACUTE OTITIS EXTERNA NEC  She has had intermittent itching and discharge at AS. Not present currently.    477.9 ALLERGIC RHINITIS NOS  She has had good cotrol with claritin in the past, this has not helped for spring time rhinitis. She denies wheezing or shortness of breath.        PE: Well developed female in no acute distress  BP 118/72   Pulse 74   Temp (Src) 97.2 (Oral)   Resp 12   Wt 174 lbs (78.9kg)  External ears and canals clear bilaterally. TM's normal bilaterally.   Nose normal without lesions or discharge.   Oropharynx: erythema without exudate. Neck supple without palpable adenopathy.  Chest is clear; no wheezes or rales.      A/P  389.9 HEARING LOSS NOS  Plan: CONSULT TO AUDIOLOGY       380.22 ACUTE OTITIS EXTERNA NEC  Plan: VOSOL-HC 2-1 % OT SOLN       477.9 ALLERGIC RHINITIS NOS  Plan: FLONASE INHA 50 MCG/DOSE NA, ZYRTEC 10 MG OR    TABS   follow up if not resolved.

## 2005-03-31 NOTE — Nursing Note (Signed)
>>   [name], Connie C     03/31/2005   3:26 pm  Pt here to discuss possible gearing loss.

## 2005-04-20 ENCOUNTER — Encounter: Payer: PPO | Admitting: Otology & Neurotology

## 2005-07-20 ENCOUNTER — Telehealth (INDEPENDENT_AMBULATORY_CARE_PROVIDER_SITE_OTHER): Payer: Self-pay | Admitting: Internal Medicine

## 2005-07-20 MED ORDER — AMBIEN 10 MG OR TABS
ORAL_TABLET | ORAL | Status: DC
Start: 2005-07-20 — End: 2005-11-15

## 2005-07-20 NOTE — Telephone Encounter (Signed)
>>   Annamarie Major Thu Jul 20, 2005 2:54 PM  Pt returning Lupe's call. No answer at [phone]. TSR informed Pt that her request was called into her Pharmacy.    >> SANTOS G( LUPE) RODRIGUEZ Thu Jul 20, 2005 2:45 PM  Mess.left for Pt.to cb.    >> [name] FERRUCCI Thu Jul 20, 2005 12:38 PM  rf approved.     >> [facility] ROD[facility] Thu Jul 20, 2005 11:50 AM  LR Ambien 01/19/05 #30 and last OV 5/06, no future appt.scheduled.     >> Annamarie Major Thu Jul 20, 2005 11:42 AM  REFILL CALL  PCP: [name], MD, MD  Prescribing Provider: [name], MD, MD  Medication(s) requested:AMBIEN 10 MG OR TABS  Dose taking now: 1 tablet by mouth at bedtime as needed for insomnia.  Reason for request: refill  Written prescription needed? NO  To Pharmacy Name: [facility]   [facility]armacy location[facility] [facility]  Pharmacy phone #[phone]  Daytime call back number: [phone] After 5: same Saturday: same   Okay to leave detailed VM or msg with someone at any of these numbers: YES    TSR - Please inform patient that refills typically take 1-2 business days and to call their pharmacy to verify status. Patient has been informed of the 1-2 business day refill rule.

## 2005-09-13 ENCOUNTER — Ambulatory Visit: Payer: Self-pay | Admitting: General Practice

## 2005-09-25 ENCOUNTER — Ambulatory Visit (INDEPENDENT_AMBULATORY_CARE_PROVIDER_SITE_OTHER): Payer: PPO

## 2005-09-25 VITALS — Temp 98.8°F

## 2005-09-25 NOTE — Progress Notes (Signed)
Addended by: Raelyn Mora on: 09/25/2005 2:49:27 PM     Modules accepted: Orders

## 2005-09-27 ENCOUNTER — Ambulatory Visit: Payer: Self-pay | Admitting: General Practice

## 2005-11-15 ENCOUNTER — Telehealth (INDEPENDENT_AMBULATORY_CARE_PROVIDER_SITE_OTHER): Payer: Self-pay | Admitting: Internal Medicine

## 2005-11-15 NOTE — Telephone Encounter (Signed)
>>   STEPHANIE A HARGROVE Thu Nov 16, 2005 9:45 AM  Pt notified.    >> Hewitt Blade Wed Nov 15, 2005 12:31 PM  Will leave for Dr Festus Aloe    >> [facility] ROD[facility]Z Wed Nov 15, 2005 10:17 AM  1-Name of requested medication: Ambien   2-Class of medication requested: Sedative/Hypnotic  3-Date last refilled: 07/20/05 #30  4-Date of last office visit: 5/06  5-Next office visit-none scheduled.    >> [employer] Wed Nov 15, 2005 10:09 AM  REFILL CALL  PCP: [name], MD  Prescribing Provider: [name], MD  Medication(s) requested:AMBIEN 10 MG OR TABS  Dose taking now: see above  Reason for request: refill  Written prescription needed? NO  To Pharmacy Name: [facility]  Pharmacy location[facility] [facility]  Pharmacy phone #:[phone]  Daytime call back number:[phone]  Okay to leave detailed VM or msg with someone at any of these numbers: YES    TSR - Please inform patient that refills typically take 1-2 business days and to call their pharmacy to verify status. Patient has been informed of the 1-2 business day refill rule.

## 2005-11-16 MED ORDER — AMBIEN 10 MG OR TABS
ORAL_TABLET | ORAL | Status: DC
Start: 2005-11-15 — End: 2007-04-03

## 2006-01-04 ENCOUNTER — Ambulatory Visit (INDEPENDENT_AMBULATORY_CARE_PROVIDER_SITE_OTHER): Payer: PPO | Admitting: Internal Medicine

## 2006-01-04 VITALS — BP 115/80 | HR 84 | Temp 97.6°F | Resp 12 | Ht 61.0 in | Wt 183.0 lb

## 2006-01-04 MED ORDER — ROZEREM 8 MG OR TABS
ORAL_TABLET | ORAL | Status: DC
Start: 2006-01-04 — End: 2007-05-13

## 2006-01-04 NOTE — Nursing Note (Signed)
>>   [name], MD     Thu Jan 04, 2006  2:17 PM  Patient here for PAP, needs RF>Ambien but is interested in "weaning off" and maybe decreasing dsg.to 5mg .

## 2006-01-04 NOTE — Progress Notes (Signed)
Connie [name] is a 45 year old female for an annual health maintenance exam. Other concerns today include the following:       493.90 UNSP ASTHMA W/O STATUS ASTHMATICUS  She has not had astham symptoms. She has mammo scheduled next week.     PREVIOUSLY ENTERED HISTORICAL INFORMATION:    Patient Active Problem List:   UNSP ASTHMA W/O STATUS ASTHMATICUS [493.90]   Priority: High [1]   Date Noted: 09/23/2004   ALLERGIC RHINITIS NOS [477.9]   Priority: High [1]   Date Noted: 05/24/2004   PROCREATIVE MGMT-COUNSEL [V26.4]   Priority: High [1]   Date Noted: 09/16/2000   H/O SPON ABORT x 2 [634.90]   Priority: High [1]    Past Medical History:   NO PAST MEDICAL HISTORY     Past Surgical History:   NO PAST SURGICAL HISTORY     Obstetric History   G4 P1 T1 P0 TAB0 SAB3 E0 M0 L1    Comment: had a w/u for her SAbs; had a successful pregnancy thereafter while on progest      Review of patient's family history indicates:   Heart Father    Lipids Mother    Other Father    Comment: Crohns      No current hospital medications on file as of 01/04/06.  Outpatient prescriptions as of 01/04/06:  AMBIEN 10 MG OR TABS, Take 1 tablet by mouth at bedtime as needed for insomnia, Disp: 30, Rfl: 0  VOSOL-HC 2-1 % OT SOLN, 2 gtts qid prn itching, Disp: 1 bottle, Rfl: 2  FLONASE INHA 50 MCG/DOSE NA, Inhale 1 spray in each nostril 2 times per day regardless of symptoms, Disp: 1bottle, Rfl: 11  ALBUTEROL 90 MCG/ACT IN AERS, Inhale 2 puffs every 4 to 6 hours as needed, Disp: 1 inhaler, Rfl: 2       Social History   Marital Status: Married Spouse Name:    Years of Education: Number of children:     Occupational History   None on file    Social History Main Topics   Tobacco Use: Quit Packs/Day: .5 Years: 10    Quit date: 08/27/1989   Alcohol Use: Yes    Comment: 1-2 every coulpe of weeks   Drug Use: No    Sexual Activity: Yes Partners with: Female   Comment: mutually mongamous since 1987    Other Topics Concern   None on file    Social History Narrative   Mom for 1 child, husband is a Administrator. Has lived in [place] since 1993.    Pt used to do glass work               PHYSICAL EXAM:    General: healthy, alert, no distress  Skin: Skin color, texture, turgor normal. No rashes or concerning lesions.  Head: Normocephalic. No masses, lesions, tenderness or abnormalities  Eyes: Lids/periorbital skin normal, Conjunctivae/corneas clear, PERRL, EOM's intact  Ears: External ears normal. Canals clear. TM's normal.  Nose:normal  Oropharynx: Lips, mucosa, and tongue normal. Teeth and gums normal.  Neck: Neck supple. No adenopathy. Thyroid symmetric, normal size, without nodules  Lungs: clear to auscultation  Heart: no murmurs, clicks, or gallops and regular rate and rhythm  Breasts: no skin lesion or nipple discharge, no mass palpated, no axillary lymphadenopathy, No obvious deformity or mass to inspection",,"nipples everted bilaterally  Abd: Abdomen soft, non-tender. BS normal. No masses or organomegaly  GU: cervix normal in appearance, no CMT, uterus normal size,  shape, and consistency, no adnexal masses or tenderness, External genitalia and vagina normal  Rectal:deferred  Ext: Normal, without deformities, edema, or skin discoloration, radial and DP pulses 2+ bilaterally  Neuro:grossly normal     ASSESSMENT AND PLAN:    1. Routine screening   PAP SMEAR (CYTOPATH, GYN)-[facility], SPECIMEN    HANDLING FEE DR->LAB, LIPID PANEL           2. Immunizations today:    Immunization History   Hepatitis A 08/22/2002    IPV 08/22/2002    Influenza 09/25/2005    MMR 08/22/2002    PPD 12/31/2001    Td (Adult) 12/31/2001     3. Preventive counseling: healthy dietary guidelines, proper exercise    493.90 UNSP ASTHMA W/O STATUS ASTHMATICUS  Note: This is stable.   Plan: observe   This note was generated using a standard template approved by the Medical Director.

## 2006-01-11 LAB — CERVICAL CANCER SCREENING: Cytologic Impression: NEGATIVE

## 2006-02-02 ENCOUNTER — Ambulatory Visit (INDEPENDENT_AMBULATORY_CARE_PROVIDER_SITE_OTHER): Payer: PPO | Admitting: Internal Medicine

## 2006-02-02 VITALS — BP 118/82 | HR 80 | Temp 97.8°F | Resp 12 | Wt 175.0 lb

## 2006-02-02 NOTE — Progress Notes (Signed)
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

## 2006-02-02 NOTE — Nursing Note (Signed)
>>   Connie [name]     Fri Feb 02, 2006  2:11 PM  Pt.here for skin tag removal-under bil axilla.

## 2006-03-07 ENCOUNTER — Other Ambulatory Visit (INDEPENDENT_AMBULATORY_CARE_PROVIDER_SITE_OTHER): Payer: Self-pay | Admitting: Internal Medicine

## 2006-03-19 DIAGNOSIS — M51379 Other intervertebral disc degeneration, lumbosacral region without mention of lumbar back pain or lower extremity pain: Secondary | ICD-10-CM | POA: Insufficient documentation

## 2006-03-22 ENCOUNTER — Encounter: Payer: Self-pay | Admitting: Orthopedic Surgery

## 2006-03-27 ENCOUNTER — Encounter: Payer: Self-pay | Admitting: Orthopedic Surgery

## 2006-04-03 ENCOUNTER — Telehealth (INDEPENDENT_AMBULATORY_CARE_PROVIDER_SITE_OTHER): Payer: Self-pay | Admitting: Internal Medicine

## 2006-04-03 MED ORDER — ALBUTEROL 90 MCG/ACT IN AERS
INHALATION_SPRAY | RESPIRATORY_TRACT | Status: DC
Start: 2006-04-03 — End: 2007-05-07

## 2006-04-03 NOTE — Telephone Encounter (Signed)
>>   Connie [name] Tue Apr 03, 2006 2:04 PM  rf approved.     >> [name] Tue Apr 03, 2006 10:53 AM  LOV 3/07-no future appt.scheduled.    >> [name] Tue Apr 03, 2006 10:37 AM  REFILL CALL  PCP: [name], MD  Prescribing Provider: [name], MD  Medication(s) requested: ALBUTEROL 90 MCG/ACT IN AERS  Dose taking now: above   Reason for request: refill   Written prescription needed? NO  [facility]acy [facility]  Pharmacy location: [place]   Pharmacy phone #: [phone]  Daytime call back number: Telephone Information:  Mobile [phone]  After 5: same Saturday: same   Okay to leave detailed VM or msg with someone at any of these numbers: YES    TSR - Please inform patient that refills typically take 1-2 business days and to call their pharmacy to verify status. YES

## 2006-04-18 ENCOUNTER — Telehealth (INDEPENDENT_AMBULATORY_CARE_PROVIDER_SITE_OTHER): Payer: Self-pay | Admitting: Internal Medicine

## 2006-04-18 MED ORDER — ZYRTEC 10 MG OR TABS
ORAL_TABLET | ORAL | Status: DC
Start: 2006-04-18 — End: 2016-09-20

## 2006-04-18 NOTE — Telephone Encounter (Signed)
>>   Connie [name] Wed Apr 18, 2006 9:53 AM  1-Name of requested medication: Zyrtec 10mg .  2-Class of medication requested: Antihistamine  3-Date last refilled: 06/16/05  4-Date of last office visit: 3/07  5-Next office visit-none scheduled.

## 2006-04-27 ENCOUNTER — Encounter: Payer: Self-pay | Admitting: Orthopedic Surgery

## 2006-05-01 ENCOUNTER — Ambulatory Visit: Payer: Self-pay | Admitting: Family Medicine

## 2006-08-28 ENCOUNTER — Telehealth (INDEPENDENT_AMBULATORY_CARE_PROVIDER_SITE_OTHER): Payer: Self-pay | Admitting: Internal Medicine

## 2006-08-28 MED ORDER — AMBIEN CR 12.5 MG OR TBCR
EXTENDED_RELEASE_TABLET | ORAL | Status: DC
Start: 2006-08-28 — End: 2007-03-29

## 2006-08-28 NOTE — Telephone Encounter (Signed)
LOV 3/07 and no future appt.scheduled.

## 2006-08-28 NOTE — Telephone Encounter (Signed)
MEDICATION CONCERN/QUESTION  ZOX:WRUEAVW Stann Mainland, MD  Doctor who prescribed meds. [name], MD   Name of medication: Ambien 10mg   Question/Concern regarding medication: Pt states that she would like to know if she could switch back to using Ambien for a month since she will be traveling to [place] and needs something that works a little better than the Rozerem. Pt sateas she would like to know if she could try the extended release Ambien she has heard of, and if she needs an appt to get this re-prescribed by Dr. Festus Aloe. Pt states she been to the travel medicine clinic, FYI. Please advise.  Pharmacy name and location: [facility]  Pharmacy phone number: [phone]  Callers name: Connie [name]   Daytime call back number:   Telephone Information:   Home Phone [phone]   Molli Knock to leave detailed VM or msg with someone at any of these numbers: YES

## 2006-08-28 NOTE — Telephone Encounter (Signed)
OK. RX faxed.

## 2006-08-28 NOTE — Telephone Encounter (Signed)
Pt was notified.  

## 2006-08-29 ENCOUNTER — Telehealth (INDEPENDENT_AMBULATORY_CARE_PROVIDER_SITE_OTHER): Payer: Self-pay | Admitting: Internal Medicine

## 2006-08-29 NOTE — Telephone Encounter (Signed)
REFILL CALL  PCP: [name], MD  Prescribing Provider:  [name], MD    Medication(s) requested:Typhoid vaccine  Dose taking now:   Reason for request: Pt sts that she was supposed have a typhoid vaccine called in, but they didn't call it in.  She needs to start this today.   This was supposed to be done by [facility].  She said it was prescribed my Sherald Hess, MD.  Their clinic closed at 4:00  Written prescription needed? N/A  To Pharmacy Name: [facility]  Pharmacy location:[place]  Pharmacy phone #:[phone]  Daytime call back number: [phone] After 5: [phone] Saturday: [phone]   Okay to leave detailed VM or msg with someone at any of these numbers: YES  TSR - Please inform patient that refills typically take 1-2 business days and to call their pharmacy to verify status. DONE

## 2006-08-30 NOTE — Telephone Encounter (Signed)
Pt.was advised to call the Travel clinic again-check w/her pharmacy first to see if the vaccine was called in and she will cb prn.

## 2006-09-26 ENCOUNTER — Telehealth (INDEPENDENT_AMBULATORY_CARE_PROVIDER_SITE_OTHER): Payer: Self-pay | Admitting: Internal Medicine

## 2006-09-26 NOTE — Telephone Encounter (Signed)
Pt.states the porcupine needle punctured under her right hand ring finger laterally-it went in about 1/2 inch and drew blood, she has no swelling/redness, just her arm feeling sore like a "tetanus shot", no streak noted, Pt.had a Tetanus inj.2003.    Per Dr.Hayes Pt.needs to monitor her finger/arm and let us know if she develops any signs of infection.

## 2006-09-26 NOTE — Telephone Encounter (Signed)
GENERAL MESSAGE    PCP: [name], MD  Main reason for the call: Pt states that she just got back from [place] and she has a porcupine quill in her purse that she forgot that she had from [place] in her purse she pierced her finger with it and is wondering is she should bee seen. Pt states that her arm feels a bit sore and tingles, slightly different than her other arm. (punctured right, left handed however)  Call back expected: YES  Daytime call back number:   Telephone Information:   Home Phone [phone]   Work Phone Not on file.   Mobile [phone]     Okay to leave detailed VM or msg with someone at any of these numbers: YES

## 2006-09-27 NOTE — Telephone Encounter (Signed)
Call to Pt., she denies any signs of infection, she will cb prn.

## 2007-03-19 ENCOUNTER — Telehealth (INDEPENDENT_AMBULATORY_CARE_PROVIDER_SITE_OTHER): Payer: Self-pay | Admitting: Internal Medicine

## 2007-03-19 NOTE — Telephone Encounter (Signed)
Left a message that per her phone call, if sx's still persist, she can either try using Monastat 3 or call and schedule an appt  For evaluation by a provider, discussed with Dr.Hayes.

## 2007-03-19 NOTE — Telephone Encounter (Signed)
TRIAGE/ADVICE  -----------  PCP: [name], MD  Main reason for calling is: Pt states she took a OTC medication for yeast infection monistat 1 day but a bit of the SX are still hanging on.   How long has this episode lasted: 5 days  Daytime call back number: [phone] After 5: same Saturday: same   Okay to leave detailed VM or msg with someone at any of these numbers: YES

## 2007-03-29 ENCOUNTER — Telehealth (INDEPENDENT_AMBULATORY_CARE_PROVIDER_SITE_OTHER): Payer: Self-pay | Admitting: Internal Medicine

## 2007-03-29 MED ORDER — AMBIEN CR 12.5 MG OR TBCR
EXTENDED_RELEASE_TABLET | ORAL | Status: DC
Start: 2007-03-29 — End: 2007-05-13

## 2007-03-29 NOTE — Telephone Encounter (Signed)
1-Name of requested medication: Ambien CR   2-Class of medication requested: Sedative/Hypnotic  3-Date last refilled: 02/18/07  4-Date of last office visit: 02/02/06  5-Next office visit: none scheduled

## 2007-04-03 ENCOUNTER — Telehealth (INDEPENDENT_AMBULATORY_CARE_PROVIDER_SITE_OTHER): Payer: Self-pay | Admitting: Internal Medicine

## 2007-04-03 MED ORDER — AMBIEN 10 MG OR TABS
ORAL_TABLET | ORAL | Status: DC
Start: 2007-04-03 — End: 2008-01-30

## 2007-04-03 MED ORDER — AMBIEN 10 MG OR TABS
ORAL_TABLET | ORAL | Status: DC
Start: 2007-04-03 — End: 2007-04-03

## 2007-04-03 NOTE — Telephone Encounter (Signed)
Patient calling on refill VM stating that she refused the rx for ambien CR at the pharmacy as she now wants generic version.  Please order if indicated.

## 2007-04-03 NOTE — Telephone Encounter (Signed)
Ordered

## 2007-04-27 ENCOUNTER — Ambulatory Visit (INDEPENDENT_AMBULATORY_CARE_PROVIDER_SITE_OTHER): Payer: PPO | Admitting: Internal Medicine

## 2007-04-27 MED ORDER — MAXALT-MLT 10 MG OR TBDP
ORAL_TABLET | ORAL | Status: DC
Start: 2007-04-27 — End: 2007-04-27

## 2007-04-27 MED ORDER — MAXALT-MLT 10 MG OR TBDP
ORAL_TABLET | ORAL | Status: DC
Start: 2007-04-27 — End: 2008-05-07

## 2007-04-27 NOTE — Progress Notes (Signed)
This patient, Connie [name] is a 46 year old female,  presenting with the following problems:     (Established Level 3= 1-3 HPI; Level 4 = 4+ HPI; Level 5 =4+ HPI  New Level 2=1-3 HPI; New Level 3 = 4+ HPI: New Level 4 = 4+ HPI; New level 5 = 4+ HPI  Location, quality, severity, duration, timing, context ,modifying factors, associated sx)    701.9 SKIN HYPERTRO/ATROPH NOS  Requests skin tag removal. 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 She has a history of migraine in the past. Currently has had headache with "stong quick onset", and triggered by bright sunlight, associated with pounding and throbbing, associcated with stress. Diffierent that usual migraine pattern. Headache may be shourt lived.She has had improvement with caffiene and ibuprofen.       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 She had had improvement with working with PT and massage and requests new referral.    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     HPI:  The above HPI includes the following elements:  context, duration, quality, severity, modifying factors and associated signs and symptoms    Past Medical History   Diagnosis Date   . NO PAST MEDICAL HISTORY            Past Surgical History   Procedure Date   . No past surgical history            Patient Active Problem List   Diagnoses Code   . PROCREATIVE MGMT-COUNSEL V26.4   . H/O SPON ABORT x 2 634.90   . ALLERGIC RHINITIS NOS 477.9   . UNSP ASTHMA W/O STATUS ASTHMATICUS 493.90           Current outpatient prescriptions   Medication Sig   . MAXALT-MLT 10 MG OR TBDP 5-10 mg ORALLY; may repeat after 2 hr, MAX 30 mg/24 hr    . AMBIEN 10 MG OR TABS Take 1 tablet by mouth at bedtime as needed for insomnia   . AMBIEN CR 12.5 MG OR TBCR 1 by mouth at bedtime as needed insomnia    . ZYRTEC 10 MG OR TABS 1 PO QD   .  ALBUTEROL 90 MCG/ACT IN AERS Inhale 2 puffs every 4 to 6 hours as needed   . ROZEREM 8 MG OR TABS 1 by mouth at bedtime as needed insomnia    . VOSOL-HC 2-1 % OT SOLN 2 gtts qid prn itching   . FLONASE INHA 50 MCG/DOSE NA Inhale 1 spray in each nostril 2 times per day regardless of symptoms           Review of patient's allergies indicates:  No Active Allergies.     Family History   Problem Relation   . Heart Father   . Lipids Mother   . Other Father     Crohns           History   Social History   . Marital Status: Married     Spouse Name: N/A     Number of Children: N/A   . Years of Education: N/A   Social History Main Topics   . Tobacco Use: Quit -- 0.5 packs/day for 10 years     Quit date:  08/27/1989   . Alcohol Use: Yes  1-2 every coulpe of weeks   . Drug Use: No   . Sexually Active: Yes -- Female partners      mutually mongamous since 1987   Other Topics Concern   . Not on file   Social History Narrative    Mom for 1 child, husband is a Administrator.  Has lived in [place] since 1993.   Pt used to do glass work          REVIEW OF SYSTEMS:  (Established Level 3= 1 ROS; Level 4 = 2-9 ROS;Level 5 = 10+  New Level 2=1 ROS; New Level 3 = 2-9 ROS ; Level 4 = 10+; Level 5 = 10+)    review of systems:    constitutional Negative   eyes As noted in HPI above  musculoskeletal As noted in HPI above  skin  As noted in HPI above  neurological As noted in HPI above      PHYSICAL EXAM:    (Established Level 3= 2-7 PE; Level 4 = 2-7 or extended of single system PE;Level 5 = 8+ PE  New Level 2=2-7 PEI; New Level 3 =2-7 PE; Level 4 = 8+ PE; Level 5 = 8+ PE)    There were no vitals taken for this visit.  CONSTITUTIONAL/GENERAL:  Appearance:  Well developed, appearing stated age and in no acute distress,  Facial features:     External ears and nose are normal in appearance without significant scars or asymmetry., Weight appears normal for height, Gait and station:  Normal.    SKIN EXAM: Significant findings include:   Inspection findings:  Bilateral axillary skin tags, Frozen with LN2 x 3 x20 sec .    EYES:  Lids and Conjunctiva Conjunctiva and lids are normal.,  Pupils: Puplis equal, round and reactive to light.  , Fundoscopic Exam: Discs are normal in appearance, there no vessel changes, no exudates or hemorrhages., non-icteric    Head : NC/ AT    EARS/NOSE/THROAT:  Otoscopic exam:  canals are clear; TMs show normal landmarks without injection or fluid behind the membranes, Hearing:  Deferred, Nose:  normal nasal mucosa, septum and turbinates, Mouth:  Lips, gums, tongue and oral mucosa are normal.  Teeth appear healthy., Pharynx:  oropharynx shows normal tonsils and adenoids without post-nasal drainage    NECK:  Inspection:  normal alignment; no masses, Thyroid:  Deferred, Cervial nodes:  no adenopathy    LYMPHATIC:  Cervical nodes:  no adenopathy, Axillary nodes:  Deferred, Inguinal nodes:  Deferred          A/P  (MDM  (Established Level 3=low; Level 4 = mod;Level 5 = high  New Level 2=sf; New Level 3 =low; Level 4 = mod; Level 5 = high)  701.9 SKIN HYPERTRO/ATROPH NOS  Plan: REMOVAL OF 1-15 SKIN TAGS            346.90 MIGRAINE NOS W/O MENTN INTRACTABLE  Note: likley migraine variant  Plan: MAXALT-MLT 10 MG OR TBDP, MAXALT-MLT 10 MG OR         TBDP        follow up if not resolved.     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

## 2007-05-07 ENCOUNTER — Telehealth (INDEPENDENT_AMBULATORY_CARE_PROVIDER_SITE_OTHER): Payer: Self-pay | Admitting: Internal Medicine

## 2007-05-07 MED ORDER — ALBUTEROL 90 MCG/ACT IN AERS
INHALATION_SPRAY | RESPIRATORY_TRACT | Status: DC
Start: 2007-05-07 — End: 2008-01-30

## 2007-05-07 NOTE — Telephone Encounter (Signed)
1-Name of requested medication: Proair HFA inhaler  2-Class of medication requested: Asthma Medication  3-Date last refilled: 04/03/06  4-Date of last office visit: 04/27/07  5-Next office visit: None scheduled  6-Is there a protocol for this medication? Yes, Asthma: Is the plan for asthma being followed? Yes, From the refill hx, does the pt. use > 1 rescue inhaler per month? No, Refill until next office visit due (6 months from the last)

## 2007-05-13 ENCOUNTER — Ambulatory Visit (INDEPENDENT_AMBULATORY_CARE_PROVIDER_SITE_OTHER): Payer: PPO | Admitting: Internal Medicine

## 2007-05-13 ENCOUNTER — Encounter (INDEPENDENT_AMBULATORY_CARE_PROVIDER_SITE_OTHER): Payer: Self-pay | Admitting: Internal Medicine

## 2007-05-13 VITALS — BP 120/80 | HR 72 | Temp 98.7°F | Resp 12

## 2007-05-13 MED ORDER — FLONASE INHA 50 MCG/DOSE NA
NASAL | Status: DC
Start: 2007-05-13 — End: 2008-05-07

## 2007-05-13 MED ORDER — DIFLUCAN 150 MG OR TABS
ORAL_TABLET | ORAL | Status: AC
Start: 2007-05-13 — End: 2007-05-14

## 2007-05-13 MED ORDER — AZITHROMYCIN 250 MG OR TABS
ORAL_TABLET | ORAL | Status: AC
Start: 2007-05-13 — End: 2007-05-18

## 2007-05-13 MED ORDER — ROBITUSSIN A-C 10-100 MG/5ML OR SYRP
ORAL_SOLUTION | ORAL | Status: AC
Start: 2007-05-13 — End: 2007-05-18

## 2007-05-13 MED ORDER — TESSALON PERLES 100 MG OR CAPS
ORAL_CAPSULE | ORAL | Status: AC
Start: 2007-05-13 — End: 2007-05-19

## 2007-05-13 NOTE — Progress Notes (Signed)
Connie [name] is a 46 year old female. Chief Complaint   Patient presents with    Cough     evaluate persistant cough x weeks    Medication management     requesting refill on flonase     1. Cough - recalls began as allergies and then seemed to settle in chest and helpless coughing and worst hs - past couple days worsening laryngitis  Feeling loosening or increased congestion in chest past 3-4 days   Generally sleeps ok   Has had chronic coughing     2. Headaches - was addressed last visit - and seems to be assoc with current cough and illness   Past Medical History   Diagnosis Date    NO PAST MEDICAL HISTORY          Past Surgical History   Procedure Date    No past surgical history          Patient Active Problem List   Diagnoses Code    PROCREATIVE MGMT-COUNSEL V26.4    H/O SPON ABORT x 2 634.90    ALLERGIC RHINITIS NOS 477.9    UNSP ASTHMA W/O STATUS ASTHMATICUS 493.90         Current outpatient prescriptions   Medication Sig    ALBUTEROL 90 MCG/ACT IN AERS Inhale 2 puffs every 4 to 6 hours as needed    MAXALT-MLT 10 MG OR TBDP 5-10 mg ORALLY; may repeat after 2 hr, MAX 30 mg/24 hr     AMBIEN 10 MG OR TABS Take 1 tablet by mouth at bedtime as needed for insomnia    ZYRTEC 10 MG OR TABS 1 PO QD    FLONASE INHA 50 MCG/DOSE NA Inhale 1 spray in each nostril 2 times per day regardless of symptoms         Family History   Problem Relation    Heart Father    Lipids Mother    Other Father     Crohns         History   Substance Use Topics    Tobacco Use: Quit -- 0.5 packs/day for 10 years     Quit date:  08/27/1989    Alcohol Use: Yes      1-2 every coulpe of weeks       Allergies:Review of patient's allergies indicates no active allergies.      ROS:  see the review of system listed in the HPI  Current social issues & past medical history reviewed with patient, see EMR.    Exam:    Gen: alert, no distress  HEENT:  Sclerae and conjunctivae clear. External ears and canals clear. TM's normal bilaterally.   No sinus tenderness is appreciated. Oropharynx normal with normal tongue and palate. Neck without palpable adenopathy.  Lungs: Clear to Auscultation bilateral.  Exam otherwise deferred.    IMPRESSION/PLAN:    477.9 ALLERGIC RHINITIS NOS  (primary encounter diagnosis)  Note: add nasal steroids to anti-hist   Plan: FLONASE INHA 50 MCG/DOSE NA        Indications & the potential side effects of the medication was thoroughly reviewed with the patient. Patient will call with any further concerns.    Remind trial should be 2 wks before giving up     Hughie Closs MD.

## 2007-05-21 ENCOUNTER — Other Ambulatory Visit (INDEPENDENT_AMBULATORY_CARE_PROVIDER_SITE_OTHER): Payer: Self-pay | Admitting: Internal Medicine

## 2007-05-22 ENCOUNTER — Ambulatory Visit: Payer: Self-pay

## 2007-08-06 ENCOUNTER — Telehealth (INDEPENDENT_AMBULATORY_CARE_PROVIDER_SITE_OTHER): Payer: Self-pay | Admitting: Internal Medicine

## 2007-08-06 NOTE — Telephone Encounter (Signed)
REFILL CALL  PCP: [name], MD  Prescribing Provider:  [name], MD  Medication(s) requested:AMBIEN 10 MG OR TABS  Dose taking now: see above  Reason for request: Refill, pt states that she had this Rx transferred to a different pharmacy near her vacation home. Pt states that the new pharmacy is telling her that the Rx cannot be transferred back to [place], so may need a new Rx, pt is out of meds  Written prescription needed? NO  To Pharmacy Name:  [facility]  Pharmacy location:[place]  Pharmacy phone #:[phone]  Daytime call back number: [phone] After 5: [phone] Saturday: [phone]   Okay to leave detailed VM or msg with someone at any of these numbers: YES    TSR - Please inform patient that refills typically take 1-2 business days and to call their pharmacy to verify status. Done.

## 2007-08-06 NOTE — Telephone Encounter (Signed)
Pt.had last RX of Ambien filled at [facility] [phone]) for #30 tabs on 07/06/07.  She was given a yr.RF by PCP on 5/08-so rx was reinstated into Pt's local pharmacy for #30 w/5 RF's only-can only be filled for 5 RF's by law.  Pt.was informed.

## 2007-08-23 ENCOUNTER — Ambulatory Visit (INDEPENDENT_AMBULATORY_CARE_PROVIDER_SITE_OTHER): Payer: PPO | Admitting: Physician Assistant

## 2007-08-23 VITALS — BP 116/88 | HR 80 | Temp 98.4°F | Resp 12 | Wt 177.6 lb

## 2007-08-23 MED ORDER — LODINE 400 MG OR TABS
ORAL_TABLET | ORAL | Status: DC
Start: 2007-08-23 — End: 2008-05-07

## 2007-08-23 NOTE — Progress Notes (Signed)
SUBJECTIVE:  Connie [name] is an 46 year old female who presents for evaluation of  L lateral thigh    Chronicity: Onset one month ago. Waxing and waning   Aggravating factors:  Seems to hurt more with standing and pressure on leg though will not it w/ walking. Abducting or adductin her thigh.   Alleviating factors: seems toget better but not completely resolved  No specific injury: was moving during the time she noted., was moving, about 15 hours per day. Now she is busy unpacking.  Associated symptoms: has noted her foot feels sl swollen but doesn't looks swollen  No back pain.   Has been told has tight ITB in he past by PT who was seeingher for her knees.   Noted stretchs seemed to make it worse.   Took motrin and heat which helped     Past Medical History   Diagnosis Date   . NO PAST MEDICAL HISTORY          Past Surgical History   Procedure Date   . No past surgical history          Current outpatient prescriptions prior to encounter   Medication Sig Dispense Refill   . FLONASE INHA 50 MCG/DOSE NA Inhale 1 spray in each nostril 2 times per day regardless of symptoms  1bottle  11   . ALBUTEROL 90 MCG/ACT IN AERS Inhale 2 puffs every 4 to 6 hours as needed  1 inhaler  2   . MAXALT-MLT 10 MG OR TBDP 5-10 mg ORALLY; may repeat after 2 hr, MAX 30 mg/24 hr   30  1 year   . AMBIEN 10 MG OR TABS Take 1 tablet by mouth at bedtime as needed for insomnia  30  11   . ZYRTEC 10 MG OR TABS 1 PO QD  30  3       Review of patient's allergies indicates:  No Active Allergies.    History   Substance Use Topics   . Tobacco Use: Quit -- 0.5 packs/day for 10 years     Quit date:  08/27/1989   . Alcohol Use: Yes      1-2 every coulpe of weeks        OBJECTIVE:  BP 116/88  Pulse 80  Temp (Src) 98.4 F (36.9 C) (Temporal)  Resp 12  Wt 177 lb 9.6 oz (80.559 kg)  General appearance: healthy, alert, no distress  SPINE: Cervical lordosis, thoracic kyphosis, and lumbar lordosis intact without accentuation or flattening. No scoliosis  noted.    Pelvis is level. No Paravertebral tightness or spasm noted. ZOX:WRUE forward flexion with smooth recovery. Backward extension  and rotation to side intact without pain. Pt able to do lateral bending without difficulty. Gait: without limp. Neuro: no thigh or calf atrophy. 5/5 muscle strength with hips, knees, foot and ankle and big toe Reflexes 2+ and symmetrical over patella, achilles. Sensation intact to light touch over L4,L5 , S1 dermatomes.    SLR-no pain elicited sitting or supine.   +pain induced with int/est rotation of hips.  Tender along the entire span of the ITB.        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 Continue heat, nsaids for 7-10day. Stretches, may hurt in the short term but overall think this should improve    RTC if no improvement or worsening sx, appearance of new sx

## 2007-12-05 ENCOUNTER — Telehealth (INDEPENDENT_AMBULATORY_CARE_PROVIDER_SITE_OTHER): Payer: Self-pay | Admitting: Internal Medicine

## 2007-12-05 NOTE — Telephone Encounter (Signed)
Referral  PCP: [name], MD  [facility] Provider who should or did order the referral. [name], MD   Reason for the call: New request for referral  Have you previously requested a referral? NO  Referral to what specialty?  podiatry  Reason for the referral: foot problems  Name of Doctor and/or Clinic:  unknown  Specialty Clinic Phone number: unknown  Specialty Clinic Fax number: unknown  Have we already made referral? NO  Caller's first and last name:Connie [name]   Caller's relation: Patient  Daytime call back number: [phone]  After 5: same Saturday: same   Okay to leave detailed VM or msg with someone at any of these numbers: YES

## 2007-12-05 NOTE — Telephone Encounter (Signed)
Patient last seen 08/23/07 by Eveline Keto, PA-C.  No referral to podiatry in chart.  Please review and advise if referral will be generated.

## 2007-12-09 NOTE — Telephone Encounter (Signed)
Ordered podiatry at [facility].

## 2007-12-10 ENCOUNTER — Encounter (INDEPENDENT_AMBULATORY_CARE_PROVIDER_SITE_OTHER): Payer: Self-pay | Admitting: Internal Medicine

## 2007-12-10 NOTE — Telephone Encounter (Signed)
Referral completed to Dr. Durward Mallard, appt scheduled.  Patient informed by both phone & letter.

## 2007-12-18 ENCOUNTER — Encounter (HOSPITAL_BASED_OUTPATIENT_CLINIC_OR_DEPARTMENT_OTHER): Payer: Self-pay | Admitting: Podiatrist

## 2007-12-25 ENCOUNTER — Encounter (HOSPITAL_BASED_OUTPATIENT_CLINIC_OR_DEPARTMENT_OTHER): Payer: PPO | Admitting: Podiatrist

## 2008-01-07 ENCOUNTER — Encounter (HOSPITAL_BASED_OUTPATIENT_CLINIC_OR_DEPARTMENT_OTHER): Payer: PPO | Admitting: Podiatrist

## 2008-01-30 ENCOUNTER — Ambulatory Visit (INDEPENDENT_AMBULATORY_CARE_PROVIDER_SITE_OTHER): Payer: PPO | Admitting: Internal Medicine

## 2008-01-30 VITALS — BP 118/80 | HR 80 | Temp 98.6°F | Resp 14 | Wt 187.0 lb

## 2008-01-30 MED ORDER — FLEXERIL 10 MG OR TABS
ORAL_TABLET | ORAL | Status: DC
Start: 2008-01-30 — End: 2008-05-07

## 2008-01-30 MED ORDER — ALBUTEROL 90 MCG/ACT IN AERS
INHALATION_SPRAY | RESPIRATORY_TRACT | Status: DC
Start: 2008-01-30 — End: 2009-08-06

## 2008-01-30 MED ORDER — AMBIEN 10 MG OR TABS
ORAL_TABLET | ORAL | Status: DC
Start: 2008-01-30 — End: 2008-08-01

## 2008-01-30 MED ORDER — VICODIN 5-500 MG OR TABS
ORAL_TABLET | ORAL | Status: DC
Start: 2008-01-30 — End: 2008-05-07

## 2008-01-30 NOTE — Progress Notes (Signed)
This patient, Connie [name] is a 47 year old female,  presenting with the following problems:     (Established Level 3= 1-3 HPI; Level 4 = 4+ HPI; Level 5 =4+ HPI  New Level 2=1-3 HPI; New Level 3 = 4+ HPI: New Level 4 = 4+ HPI; New level 5 = 4+ HPI  Location, quality, severity, duration, timing, context ,modifying factors, associated sx)    724.2 LOW BACK PAIN(LUMBAGO)  (primary encounter diagnosis)  She complains of R lower back pain. She fell on her L knee on 3/1 and was feeling fine after that. She was planting bulbs 3/4 and felt fine. She awoke overnight with back pain radiationg to the R buttock. She denies  motor or sensory impairment.     She denies urinary symptoms. She denies fever.         HPI:  The above HPI includes the following elements:  context, duration, location, quality and associated signs and symptoms    Past Medical History   Diagnosis Date   . NO PAST MEDICAL HISTORY          Past Surgical History   Procedure Date   . No past surgical history          Patient Active Problem List   Diagnoses Code   . PROCREATIVE MGMT-COUNSEL V26.4   . H/O SPON ABORT x 2 634.90   . ALLERGIC RHINITIS NOS 477.9   . UNSP ASTHMA W/O STATUS ASTHMATICUS 493.90         Current outpatient prescriptions   Medication Sig   . LODINE 400 MG OR TABS 1 TABLET THREE TIMES  DAILY    . FLONASE INHA 50 MCG/DOSE NA Inhale 1 spray in each nostril 2 times per day regardless of symptoms   . ALBUTEROL 90 MCG/ACT IN AERS Inhale 2 puffs every 4 to 6 hours as needed   . MAXALT-MLT 10 MG OR TBDP 5-10 mg ORALLY; may repeat after 2 hr, MAX 30 mg/24 hr    . AMBIEN 10 MG OR TABS Take 1 tablet by mouth at bedtime as needed for insomnia   . ZYRTEC 10 MG OR TABS 1 PO QD         Review of patient's allergies indicates:  No Active Allergies.     Family History   Problem Relation   . Heart Father   . Lipids Mother   . Other Father     Crohns         History   Social History   . Marital Status: Married     Spouse Name: N/A     Number of Children:  N/A   . Years of Education: N/A   Social History Main Topics   . Tobacco Use: Quit -- 0.5 packs/day for 10 years     Quit date:  08/27/1989   . Alcohol Use: Yes      1-2 every coulpe of weeks   . Drug Use: No   . Sexually Active: Yes -- Female partner(s)      mutually mongamous since 1987   Other Topics Concern   . Not on file   Social History Narrative    Mom for 1 child, husband is a Administrator.  Has lived in [place] since 1993.   Pt used to do glass work          REVIEW OF SYSTEMS:  (Established Level 3= 1 ROS; Level 4 = 2-9 ROS;Level 5 = 10+  New  Level 2=1 ROS; New Level 3 = 2-9 ROS ; Level 4 = 10+; Level 5 = 10+)    review of systems:    constitutional Negative   genitourinary  Negative  musculoskeletal As noted in HPI above  neurological As noted in HPI above      PHYSICAL EXAM:    (Established Level 3= 2-7 PE; Level 4 = 2-7 or extended of single system PE;Level 5 = 8+ PE  New Level 2=2-7 PEI; New Level 3 =2-7 PE; Level 4 = 8+ PE; Level 5 = 8+ PE)    BP 118/80  Pulse 80  Temp 98.6 F (37 C)  Resp 14  Wt 187 lb (84.823 kg)  CONSTITUTIONAL/GENERAL:  Appearance:  Well developed, appearing stated age and in no acute distress,  Facial features:     External ears and nose are normal in appearance without significant scars or asymmetry., Weight appears heavy for height, Gait and station:  Normal.    SKIN EXAM: Negative for rashes suspicious nevi or other abnormalities.    EYES:  Lids and Conjunctiva Conjunctiva and lids are normal.,  Pupils: Deferred., Fundoscopic Exam: Deferred., non-icteric    Head : NC/ AT    Lumbo-sacral spine area reveals no mass.  Local tenderness at L SI joint  Painful and reduced LS ROM noted. Straight leg raise is neg at 90 degrees.  DTR  Absent throughout the lower ext. , gait normal including heel and toe gait.  Peripheral pulses are palpable.   motor is +5/5 in lext.     A/P  (MDM  (Established Level 3=low; Level 4 = mod;Level 5 = high  New Level 2=sf; New Level 3 =low; Level 4 =  mod; Level 5 = high)      724.2 LOW BACK PAIN(LUMBAGO)  (primary encounter diagnosis)  Comment: mechanical, likely SI joint strain  Plan: MOTRIN IB 200 MG OR TABS, REFERRAL TO PHYSICAL         THERAPY, VICODIN 5-500 MG OR TABS, FLEXERIL 10         MG OR TABS        follow up if not resolved.            MDM: MEDICAL DECISION MAKING:  # of diagnoses or management options: multiple (c)  Amount and/or complexity of data to be reviewed: limited (b)  Risk of complications and/or morbidity or mortality: moderate (c)     Time spent for this visit was 25 min, greater than 50% of which was face to face counselling on 724.2 LOW BACK PAIN(LUMBAGO)  (primary encounter diagnosis).

## 2008-01-30 NOTE — Progress Notes (Signed)
Pt.c/o LBP/right hip pain x 2d.,using Motrin prn, heat/cold, ? Related to a fall last Sunday.

## 2008-05-07 ENCOUNTER — Ambulatory Visit (INDEPENDENT_AMBULATORY_CARE_PROVIDER_SITE_OTHER): Payer: PPO | Admitting: Internal Medicine

## 2008-05-07 ENCOUNTER — Encounter (INDEPENDENT_AMBULATORY_CARE_PROVIDER_SITE_OTHER): Payer: Self-pay | Admitting: Internal Medicine

## 2008-05-07 VITALS — BP 104/77 | HR 88 | Temp 98.6°F | Resp 12 | Wt 180.0 lb

## 2008-05-07 MED ORDER — FLONASE 50 MCG/ACT NA SUSP
NASAL | Status: DC
Start: 2008-05-07 — End: 2009-07-30

## 2008-05-07 NOTE — Progress Notes (Signed)
The patient is a 47 year old year old  female who presents for follow up of:    757.39 SKIN ANOMALY NEC  (primary encounter diagnosis)  She notes that skin tags at both axilla cuts on her clothing.      477.9 ALLERGIC RHINITIS NOS    She requires a refill of medications      V76.10 SCREENING MAL NEOP-BREAST,UNSPEC  She is due for mammogram.  PE: WD female in NAD, alert and  cooperative  BP 104/77  Pulse 88  Temp(Src) 98.6 F (37 C) (Temporal)  Resp 12  Wt 180 lb (81.647 kg)  Bilateral Axillary skin tags ranging one to 3 mm in size frozen with liquid nitrogen x 2  A/P  757.39 SKIN ANOMALY NEC  (primary encounter diagnosis)  Comment: follow up if not resolved.   Plan: REMOVAL OF 1-15 SKIN TAGS            477.9 ALLERGIC RHINITIS NOS  Comment: refill meds   Plan: FLONASE 50 MCG/ACT NA SUSP            V76.10 SCREENING MAL NEOP-BREAST,UNSPEC  Plan: MAMMOGRAM, SCREENING            She will schedule for health maintenance exam

## 2008-06-18 ENCOUNTER — Ambulatory Visit: Payer: Self-pay | Admitting: Family Medicine

## 2008-07-06 ENCOUNTER — Telehealth (INDEPENDENT_AMBULATORY_CARE_PROVIDER_SITE_OTHER): Payer: Self-pay | Admitting: Internal Medicine

## 2008-07-06 NOTE — Telephone Encounter (Signed)
Connie [name] from [facility] called f/u on Rx he faxed on 06/29/08 looking for custom arch supports.  Please call Connie [name] at [phone].  If Rx is found please fax to [phone].

## 2008-07-07 NOTE — Telephone Encounter (Signed)
Received fax and faxed back to [phone].

## 2008-07-09 ENCOUNTER — Other Ambulatory Visit (INDEPENDENT_AMBULATORY_CARE_PROVIDER_SITE_OTHER): Payer: Self-pay | Admitting: Internal Medicine

## 2008-07-09 NOTE — Telephone Encounter (Signed)
USED IF Rx NOT FOUND, DOSE IS DIFFERENT, OR IF DOESN'T MATCH WHAT IS IN EPIC.    Pt states she is all out of Rx Ambien. Pt is requesting a Rx refill today.   RFC: Med Management    Provider name on bottle:  [name], MD    PCP: [name], MD  Medication(s) requested:  Generic Ambien   Dose taking now:  unknown   Reason for request:  Routine   Written prescription needed? YES  Pharmacy name: [facility]   Pharmacy location:  [place]   Pharmacy phone #: [phone]  Okay to leave detailed VM or message with someone at any of these numbers? YES    TSR - Please inform patient that refills typically take 1-2 business days and to call their pharmacy to verify status.  Done

## 2008-07-09 NOTE — Telephone Encounter (Signed)
Pt.has RFs available at [facility] but she is currently staying at [facility] she was advised to contact [facility] and have them tx the rx to [facility] for 54mo.

## 2008-08-01 ENCOUNTER — Other Ambulatory Visit (INDEPENDENT_AMBULATORY_CARE_PROVIDER_SITE_OTHER): Payer: Self-pay | Admitting: Internal Medicine

## 2008-08-02 MED ORDER — AMBIEN 10 MG OR TABS
ORAL_TABLET | ORAL | Status: DC
Start: 2008-08-01 — End: 2009-01-26

## 2008-10-30 ENCOUNTER — Ambulatory Visit (INDEPENDENT_AMBULATORY_CARE_PROVIDER_SITE_OTHER): Payer: PPO | Admitting: Physician Assistant

## 2008-10-30 VITALS — BP 116/90 | HR 90 | Temp 98.6°F

## 2008-10-30 MED ORDER — AZITHROMYCIN 250 MG OR TABS
ORAL_TABLET | ORAL | Status: AC
Start: 2008-10-30 — End: 2008-11-04

## 2008-10-30 MED ORDER — AEROCHAMBER MV MISC
Status: DC
Start: 2008-10-30 — End: 2016-09-20

## 2008-10-30 MED ORDER — FLOVENT HFA 110 MCG/ACT IN AERO
INHALATION_SPRAY | RESPIRATORY_TRACT | Status: DC
Start: 2008-10-30 — End: 2009-08-06

## 2008-10-30 NOTE — Progress Notes (Signed)
Connie [name] is a 47 year old female  established patient who presents with the following symptoms:     1.  Chief Complaint   Patient presents with   . Cough     x 1 1/2 wks      Typically yearly, starts as mild URI turns into bronchitis, Albuterol and antibiotics tends to help a lot.   Constant cough, harder to breath deeply/chest constriction. Now has green sputum  No sinus symptoms  A hoarse voice initially, slight sore.  Restarted Albuterol twice a day, helpful  Diagnosed with asthma in past, mild, intermittent  In 2003 used Flovent, uses Flonase occasionally.    Currently using OTC product(s):     Patient Active Problem List   Diagnoses Code   . PROCREATIVE MGMT-COUNSEL V26.4   . H/O SPON ABORT x 2 634.90   . ALLERGIC RHINITIS NOS 477.9   . UNSP ASTHMA W/O STATUS ASTHMATICUS 493.90       Past Medical History   Diagnosis Date   . NO PAST MEDICAL HISTORY        History   Social History   . Marital Status: Married     Spouse Name: N/A     Number of Children: N/A   . Years of Education: N/A   Occupational History   . Not on file.   Social History Main Topics   . Tobacco Use: Quit -- 0.5 packs/day for 10 years     Quit date: 08/27/1989   . Alcohol Use: Yes      1-2 every coulpe of weeks   . Drug Use: No   . Sexually Active: Yes -- Female partner(s)      mutually mongamous since 1987     OBJECTIVE:  Blood pressure 140/90, pulse 90, temperature 98.6 F (37 C), temperature source Temporal.    GEN: no acute distress, pleasant, infrequent cough  EYES: lids appear normal, no swelling or erythema, PERRLA, EOM's intact, sclerae white and conjunctiva clear  EARS: external ear, canal and TM normal  NOSE: no rhinorrhea  SINUSES: nontender  THROAT: moist mucus membranes, no erythema, without exudates or tonsilar hypertrophy and halitosis absent  NECK: supple and no adenopathy  LUNGS: minimal course upper breath sounds with forced expiration, no w/r/r    ASSESSMENT/PLAN:  Encounter Diagnoses   Name Primary?   . ACUTE BRONCHITIS  Yes   . UNSP ASTHMA W/O STATUS ASTHMATICUS    . VACCINE FOR INFLUENZA      Most likely still viral illness that is aggravated her mild, intermittent asthma. Discussed possibility of developing secondary infection and ABX course may be needed and if starting to wheezing, more Albuterol use, to start Flovent again.  ABX script given to hold on to and will start should worsening symptoms begin. Reviewed possible side effects if does take ABX and still will need to recheck in clinic  for persistence, worsening, appearance of new symptoms. Add aerochamber if needed, reviewed MDI use.

## 2008-11-02 NOTE — Progress Notes (Addendum)
Addended by: FORTE, MELISSA L on: 11/02/2008      Modules accepted: Level of Service

## 2008-11-02 NOTE — Progress Notes (Addendum)
Addended by: FORTE, MELISSA L on: 11/02/2008      Modules accepted: Orders

## 2008-12-10 ENCOUNTER — Encounter (INDEPENDENT_AMBULATORY_CARE_PROVIDER_SITE_OTHER): Payer: PPO | Admitting: Internal Medicine

## 2008-12-18 ENCOUNTER — Encounter (INDEPENDENT_AMBULATORY_CARE_PROVIDER_SITE_OTHER): Payer: PPO | Admitting: Internal Medicine

## 2008-12-22 ENCOUNTER — Encounter (INDEPENDENT_AMBULATORY_CARE_PROVIDER_SITE_OTHER): Payer: PPO | Admitting: Internal Medicine

## 2008-12-31 ENCOUNTER — Telehealth (INDEPENDENT_AMBULATORY_CARE_PROVIDER_SITE_OTHER): Payer: Self-pay | Admitting: Internal Medicine

## 2008-12-31 NOTE — Telephone Encounter (Signed)
Done

## 2008-12-31 NOTE — Telephone Encounter (Signed)
LOV 12/09 w/Linda Vermette for URI, scheduled for PE w/PCP 3/10.

## 2008-12-31 NOTE — Telephone Encounter (Signed)
ORDER/TEST REQUESTED    Provider who should order test. [name], MD   Requests an order for: blood draw  Reason for request: routine  Callback expected:YES  Okay to leave a VM or msg with anyone at this number? YES   TSR-inform pt their call will be returned within 2 business days

## 2008-12-31 NOTE — Telephone Encounter (Signed)
Please call Pt.to schedule for fasting labs, thanks.

## 2009-01-01 ENCOUNTER — Encounter (INDEPENDENT_AMBULATORY_CARE_PROVIDER_SITE_OTHER): Payer: PPO | Admitting: Internal Medicine

## 2009-01-04 ENCOUNTER — Telehealth (INDEPENDENT_AMBULATORY_CARE_PROVIDER_SITE_OTHER): Payer: Self-pay | Admitting: Internal Medicine

## 2009-01-04 NOTE — Telephone Encounter (Signed)
Please help schedule this Pt.

## 2009-01-04 NOTE — Telephone Encounter (Signed)
APPOINTMENT REQUEST  Requests appointment with [name], MD  Reason for Visit:  Skin tag removal (about 6 under her arms.)  Preferred Date: Patient stated that she does not have a preference for days.   Preferred Time : after 9:15 and 9:30 am   Appt times already offered: see above   Caller's Name:Rossanna L Abbey Chatters to leave vm message YES  TSR-inform caller to expect a call back within 1 business day.

## 2009-01-05 NOTE — Telephone Encounter (Signed)
Left message for pt to return call and schedule procedure.

## 2009-01-15 ENCOUNTER — Ambulatory Visit (INDEPENDENT_AMBULATORY_CARE_PROVIDER_SITE_OTHER): Payer: PPO | Admitting: Internal Medicine

## 2009-01-26 ENCOUNTER — Ambulatory Visit (INDEPENDENT_AMBULATORY_CARE_PROVIDER_SITE_OTHER): Payer: PPO | Admitting: Internal Medicine

## 2009-01-26 ENCOUNTER — Encounter (INDEPENDENT_AMBULATORY_CARE_PROVIDER_SITE_OTHER): Payer: Self-pay | Admitting: Internal Medicine

## 2009-01-26 ENCOUNTER — Other Ambulatory Visit (INDEPENDENT_AMBULATORY_CARE_PROVIDER_SITE_OTHER): Payer: PPO

## 2009-01-26 VITALS — BP 122/72 | HR 80 | Temp 98.8°F | Resp 12 | Ht 61.5 in | Wt 178.0 lb

## 2009-01-26 LAB — CBC, DIFF
% Basophils: 0 % (ref 0–1)
% Eosinophils: 1 % (ref 0–7)
% Immature Granulocytes: 0 % (ref 0–1)
% Lymphocytes: 25 % (ref 19–53)
% Monocytes: 7 % (ref 5–13)
% Neutrophils: 67 % (ref 34–71)
Absolute Eosinophil Count: 0.07 10*3/uL (ref 0.00–0.50)
Absolute Lymphocyte Count: 1.77 10*3/uL (ref 1.00–4.80)
Basophils: 0.02 10*3/uL (ref 0.00–0.20)
Hematocrit: 41 % (ref 36–45)
Hemoglobin: 13.8 g/dL (ref 11.5–15.5)
Immature Granulocytes: 0.01 10*3/uL (ref 0.00–0.05)
MCH: 29.8 pg (ref 27.3–33.6)
MCHC: 33.9 g/dL (ref 32.2–36.5)
MCV: 88 fL (ref 81–98)
Monocytes: 0.49 10*3/uL (ref 0.00–0.80)
Neutrophils: 4.7 10*3/uL (ref 1.80–7.00)
Platelet Count: 212 10*3/uL (ref 150–400)
RBC: 4.63 mil/uL (ref 3.80–5.00)
RDW-CV: 12.3 % (ref 11.6–14.4)
WBC: 7.06 10*3/uL (ref 4.3–10.0)

## 2009-01-26 MED ORDER — AMBIEN 10 MG OR TABS
ORAL_TABLET | ORAL | Status: DC
Start: 2009-01-26 — End: 2010-01-12

## 2009-01-26 NOTE — Progress Notes (Signed)
Pt.here for PE/PAP-fasting, needs RF>Ambien.

## 2009-01-26 NOTE — Progress Notes (Signed)
Connie [name] is a 48 year old female for an annual health maintenance exam.     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 NO PAST MEDICAL HISTORY          Past Surgical History   Procedure Date   . No past surgical history          Obstetric History     G4   P1   T1   P0    TAB0   SAB3   E0   M0   L1    had a w/u for her SAbs; had a successful pregnancy thereafter while on progest         Family History   Problem Relation   . Heart disease Father   . Lipids Mother   . Other Father     Crohns            Prior Encounter Medications   Medication Sig Dispense Refill   . AEROCHAMBER MV MISC to use with Albuterol MDI  1  0   . ALBUTEROL 90 MCG/ACT IN AERS Inhale 2 puffs every 4 to 6 hours as needed  1 inhaler  2   . AMBIEN 10 MG OR TABS Take 1 tablet by mouth at bedtime as needed for insomnia  30  11   . FLONASE 50 MCG/ACT NA SUSP 1 spray to each nostril twice daily regardless of symptoms  1   11   . FLOVENT HFA 110 MCG/ACT IN AERO Inhale 1 puff by mouth twice daily regardless of symptoms, rinse mouth after use  1inhaler  1   . ZYRTEC 10 MG OR TABS 1 PO QD  30  3          History   Social History   . Marital Status: Married     Spouse Name: N/A     Number of Children: N/A   . Years of Education: N/A   Occupational History   . Not on file.   Social History Main Topics   . Tobacco Use: Quit -- 0.5 packs/day for 10 years     Quit date: 08/27/1989   . Alcohol Use: Yes      1-2 every coulpe of weeks   . Drug Use: No   . Sexually Active: Yes -- Female partner(s)      mutually mongamous since 1987   Other Topics Concern   . Exercise No   Social History Narrative    Mom for 1  child, husband is a Administrator.  Has lived in [place] since 1993.   Pt used to do glass work                PHYSICAL EXAM:    General: healthy, alert, no distress  Skin: Skin color, texture, turgor normal. No rashes or concerning lesions.  Head: Normocephalic. No masses, lesions, tenderness or abnormalities  Eyes: Conjunctivae/corneas clear, PERRL, EOM's intact  Ears: External ears normal. Canals clear. TM's normal.  Nose:normal  Oropharynx: Lips, mucosa, and tongue normal. Teeth and gums normal.  Neck: Neck supple. No adenopathy. Thyroid symmetric, normal size, without nodules  Lungs: clear to auscultation  Heart: no murmurs, clicks, or gallops and regular rate and rhythm  Breasts: no skin lesion or nipple discharge, no mass palpated, no axillary lymphadenopathy, No obvious deformity or mass to inspection",,"nipples everted bilaterally  Abd: Abdomen soft, non-tender. BS normal. No masses or organomegaly  Pelvic: external genitalia normal.  vaginal vault normal.  cervix normal and without ectopy, friablity  or discharge. Bimanual exam diffusely enlarged uterus to approximately 10 weeks without tenderness.  Rectal:deferred  Ext: Normal, without deformities, edema, or skin discoloration, radial and DP pulses 2+ bilaterally  Neuro:grossly normal     ASSESSMENT AND PLAN:    1. Routine screening      COMPREHENSIVE METABOLIC PANEL, CBC, DIFF, PAP         SMEAR (CYTOPATH, GYN)-[facility]        LIPID PANEL, VENIPUNCTURE FEE, SPECIMEN         HANDLING FEE DR->LAB              2. Immunizations today:       Immunization History   Administered Date(s) Administered   . Hepatitis A 08/22/2002, 08/01/2006   . IPV ([place]) 08/22/2002   . Influenza 09/25/2005   . MMR 08/22/2002   . PPD 12/31/2001   . Pandemic Influenza 10/30/2008   . Td (Adult) 12/31/2001   . Typhoid 08/01/2006   . Yellow Fever 08/01/2006          3. Preventive counseling: importance of regular dental care, healthy dietary guidelines, adequate (1200-1500mg /d) calcium  intake, vitamin D:     ==========================================================================  The following represents signficant issues addressed separately at this visit.        692.9 DERMATITIS NOS  She desires cosmetic derm for skin pigmentation and old acne scarring     311 DEPRESSIVE DISORDER NEC  She complains of intermittent depressed mood and year stability.  She has questions whether this is related to menopause.  She reports difficulty with weight reduction.  She denies any hot flashes.  She has not missed any periods.    757.39 SKIN ANOMALY NEC  She complains of axillary skin tags that she would like removed.        218.9 UTERINE LEIOMYOMA NOS    noted incidentally on exam.  She has not had heavy menstrual flow.  She denies urinary frequency or constipation.  PE:  BP 122/72  Pulse 80  Temp 98.8 F (37.1 C)  Resp 12  Ht 5' 1.5" (1.562 m)  Wt 178 lb (80.74 kg)  L axilla 4 skin tags and R axilla 2 skin tags frozen with liquid nitrogen x 2  Pelvic: external genitalia normal.  vaginal vault normal.  cervix normal and without ectopy, friablity  or discharge. Bimanual exam diffusely enlarged uterus to approximately 10 weeks without tenderness.  Facial acne scarring noted  A/P    692.9 DERMATITIS NOS  Plan: REFERRAL TO DERMATOLOGY            311 DEPRESSIVE DISORDER NEC  Comment: check labs.  We discussed options for medications that might improve symptoms.  I suggested SSRIs.  She will consider.  Plan: THYROID STIMULATING HORMONE, FOLLICLE         STIMULATING HORMONE            757.39 SKIN ANOMALY NEC  Plan: DESTRUCT BENIGN LESION, 1-14  Follow-up if not resolved    218.9 UTERINE LEIOMYOMA NOS  Comment: likely uterine fibroids  Plan: US EXAM, PELVIC, COMPLETE        Follow-up after study          This note was generated using a standard template approved by the Medical Director.

## 2009-01-26 NOTE — Patient Instructions (Addendum)
[facility]             Phone [phone]             [address]             [address] [place] [address].    Carol J. Henry, PhD.  [address] [place] [address] Fax [phone]  Phone [phone]

## 2009-01-27 LAB — LIPID PANEL
Cholesterol (LDL): 120 mg/dL (ref <130)
Cholesterol/HDL Ratio: 4.9
HDL Cholesterol: 41 mg/dL (ref >40)
Non-HDL Cholesterol: 160 mg/dL — ABNORMAL HIGH (ref 0–159)
Total Cholesterol: 201 mg/dL — ABNORMAL HIGH (ref <200)
Triglyceride: 199 mg/dL — ABNORMAL HIGH (ref <150)

## 2009-01-27 LAB — FOLLICLE STIMULATING HORMONE: Follicle Stimulating Hormone: 3 m[IU]/mL

## 2009-01-27 LAB — COMPREHENSIVE METABOLIC PANEL
ALT (GPT): 28 U/L (ref 6–40)
AST (GOT): 26 U/L (ref 15–40)
Albumin: 4.2 g/dL (ref 3.5–5.2)
Alkaline Phosphatase (Total): 70 U/L (ref 34–121)
Anion Gap: 10 (ref 3–11)
Bilirubin (Total): 0.9 mg/dL (ref 0.2–1.3)
Calcium: 9.5 mg/dL (ref 8.9–10.2)
Carbon Dioxide, Total: 25 mEq/L (ref 22–32)
Chloride: 102 mEq/L (ref 98–108)
Creatinine: 0.7 mg/dL (ref 0.2–1.1)
GFR, Calc, African American: >60 mL/min (ref >59)
GFR, Calc, European American: >60 mL/min (ref >59)
Glucose: 103 mg/dL (ref 62–125)
Potassium: 4.6 mEq/L (ref 3.7–5.2)
Protein (Total): 6.4 g/dL (ref 6.0–8.2)
Sodium: 137 mEq/L (ref 136–145)
Urea Nitrogen: 8 mg/dL (ref 8–21)

## 2009-01-27 LAB — THYROID STIMULATING HORMONE: Thyroid Stimulating Hormone: 1.978 u[IU]/mL (ref 0.400–5.00)

## 2009-02-04 ENCOUNTER — Encounter (INDEPENDENT_AMBULATORY_CARE_PROVIDER_SITE_OTHER): Payer: Self-pay | Admitting: Internal Medicine

## 2009-02-04 LAB — CERVICAL CANCER SCREENING: Cytologic Impression: NEGATIVE

## 2009-04-09 ENCOUNTER — Encounter (HOSPITAL_BASED_OUTPATIENT_CLINIC_OR_DEPARTMENT_OTHER): Payer: PPO

## 2009-04-09 LAB — PR US PELVIS COMPLETE W TRANSVAG

## 2009-04-14 ENCOUNTER — Telehealth (INDEPENDENT_AMBULATORY_CARE_PROVIDER_SITE_OTHER): Payer: Self-pay | Admitting: Internal Medicine

## 2009-04-14 ENCOUNTER — Encounter (HOSPITAL_BASED_OUTPATIENT_CLINIC_OR_DEPARTMENT_OTHER): Payer: PPO

## 2009-04-14 NOTE — Telephone Encounter (Signed)
RESULTS CALL  Provider who ordered the test(s): [name], MD   Results for what test(s)?Imaging  Where was the test done?: [facility]   When was test(s) done?: 5-14  Okay to leave VM or msg with anyone at this number? YES  Done TSR-inform pt the clinic will return their call within 1-2 business days.

## 2009-04-14 NOTE — Telephone Encounter (Signed)
Pt.was informed-she will make f/u w/PCP.

## 2009-04-14 NOTE — Telephone Encounter (Signed)
Her u/s is normal.

## 2009-04-14 NOTE — Telephone Encounter (Signed)
Please review Pelvic US results.

## 2009-05-18 ENCOUNTER — Ambulatory Visit: Payer: Self-pay | Admitting: Family Medicine

## 2009-05-24 ENCOUNTER — Ambulatory Visit (INDEPENDENT_AMBULATORY_CARE_PROVIDER_SITE_OTHER): Payer: PPO | Admitting: Internal Medicine

## 2009-05-24 ENCOUNTER — Encounter (INDEPENDENT_AMBULATORY_CARE_PROVIDER_SITE_OTHER): Payer: Self-pay | Admitting: Internal Medicine

## 2009-05-24 VITALS — BP 160/90 | HR 80 | Temp 99.2°F | Resp 16 | Wt 181.0 lb

## 2009-05-24 MED ORDER — PREDNISONE 10 MG OR TABS
ORAL_TABLET | ORAL | Status: DC
Start: 2009-05-24 — End: 2011-02-24

## 2009-05-24 NOTE — Patient Instructions (Signed)
Don't take ibuprofen while on prednisone.    After prednisone take ibuprofen 800  Mg by mouth three times per day.

## 2009-05-24 NOTE — Progress Notes (Signed)
p c/o back pain in left shoulder, started ~ 3 weeks ago. Pain is radiating down left shoulder/arm. Pain worse when shoulders hunched, sitting at desk. Laying down helps as does heat. Pt has been using 400mg  Ibuprofen with some relief

## 2009-05-24 NOTE — Progress Notes (Signed)
This patient, Connie [name] is a 48 year old female,  presenting with the following problems:     (Established Level 3= 1-3 HPI; Level 4 = 4+ HPI; Level 5 =4+ HPI  New Level 2=1-3 HPI; New Level 3 = 4+ HPI: New Level 4 = 4+ HPI; New level 5 = 4+ HPI  Location, quality, severity, duration, timing, context ,modifying factors, associated sx)    723.1 CERVICALGIA  (primary encounter diagnosis)  She complains of L (dominant) radicular neck pain to finger 3 and 4. She denies weakness. Symptoms has been since gardening 2.5 weeks ago.  She recalls no acute trauma. She denies fever.  She has had mild improvement with ibuprofen.    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        HPI:  The above HPI includes the following elements:  context, duration, location, quality, modifying factors and associated signs and symptoms    Past Medical History   Diagnosis Date   . NO SIGNIFICANT PAST MEDICAL HX          Past Surgical History   Procedure Date   . No prior surgeries          Patient Active Problem List   Diagnoses Code   . PROCREATIVE MGMT-COUNSEL V26.4   . H/O SPON ABORT x 2 634.90   . ALLERGIC RHINITIS NOS 477.9   . UNSP ASTHMA W/O STATUS ASTHMATICUS 493.90            Current outpatient prescriptions   Medication Sig   . AEROCHAMBER MV MISC to use with Albuterol MDI   . ALBUTEROL 90 MCG/ACT IN AERS Inhale 2 puffs every 4 to 6 hours as needed   . FLONASE 50 MCG/ACT NA SUSP 1 spray to each nostril twice daily regardless of symptoms   . FLOVENT HFA 110 MCG/ACT IN AERO Inhale 1 puff by mouth twice daily regardless of symptoms, rinse mouth after use   . Ibuprofen 200 MG OR CAPS prn   . PredniSONE 10 MG OR TABS 3 TABLETS  DAILY   . Zolpidem Tartrate (AMBIEN) 10 MG OR TABS Take 1 tablet by mouth at bedtime as needed for insomnia   . ZYRTEC 10 MG OR TABS 1 PO QD         Review of patient's allergies indicates:  No Active Allergies     Family History   Problem Relation   . Heart disease Father   . Lipids Mother   .  Other Father     Crohns         History   Social History   . Marital Status: Married     Spouse Name: N/A     Number of Children: N/A   . Years of Education: N/A   Social History Main Topics   . Tobacco Use: Quit -- 0.5 packs/day for 10 years     Quit date: 08/27/1989   . Alcohol Use: Yes      1-2 every coulpe of weeks   . Drug Use: No   . Sexually Active: Yes -- Female partner(s)      mutually mongamous since 1987   Other Topics Concern   . Exercise No   Social History Narrative    Mom for 1 child, husband is a Administrator.  Has lived in [place] since 1993.   Pt used to do glass work          REVIEW OF SYSTEMS:  (  Established Level 3= 1 ROS; Level 4 = 2-9 ROS;Level 5 = 10+  New Level 2=1 ROS; New Level 3 = 2-9 ROS ; Level 4 = 10+; Level 5 = 10+)    review of systems:    constitutional Negative   musculoskeletal As noted in HPI above  neurological As noted in HPI above      PHYSICAL EXAM:    (Established Level 3= 2-7 PE; Level 4 = 2-7 or extended of single system PE;Level 5 = 8+ PE  New Level 2=2-7 PEI; New Level 3 =2-7 PE; Level 4 = 8+ PE; Level 5 = 8+ PE)    BP 160/90  Pulse 80  Temp(Src) 99.2 F (37.3 C) (Temporal)  Resp 16  Wt 181 lb (82.101 kg)  CONSTITUTIONAL/GENERAL:  Appearance:  Well developed, appearing stated age and in no acute distress,  Facial features:     External ears and nose are normal in appearance without significant scars or asymmetry., Weight appears heavy for height, Gait and station:  Normal.    SKIN EXAM: Negative for rashes suspicious nevi or other abnormalities.    EYES:  Lids and Conjunctiva Conjunctiva and lids are normal.,      Neck and back: Normal configuration.  Full rom at neck.  Motor exam +5 /5 throughout upper ext.  No boney or muscular tenderness    Reflexes:  t+2/4 b +2/4      A/P  (MDM  (Established Level 3=low; Level 4 = mod;Level 5 = high  New Level 2=sf; New Level 3 =low; Level 4 = mod; Level 5 = high)  723.1 CERVICALGIA  (primary encounter diagnosis)  Comment:  likely cervical radiculopathy  Plan: IBUPROFEN 200 MG OR CAPS, PREDNISONE 10 MG OR         TABS        See patient instructions .  I advised follow-up early next week.  Due to planned travel she is not able to do this.  We discussed need for further evaluation if symptoms persist.    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

## 2009-07-15 ENCOUNTER — Other Ambulatory Visit (INDEPENDENT_AMBULATORY_CARE_PROVIDER_SITE_OTHER): Payer: Self-pay | Admitting: Internal Medicine

## 2009-07-15 NOTE — Telephone Encounter (Signed)
Pt.has RFs available but insurance only allows a 43mo.fill not 52yr.  Ambien was called into [facility] and Pt.was informed.

## 2009-07-30 ENCOUNTER — Other Ambulatory Visit (INDEPENDENT_AMBULATORY_CARE_PROVIDER_SITE_OTHER): Payer: Self-pay | Admitting: Internal Medicine

## 2009-07-30 MED ORDER — FLONASE 50 MCG/ACT NA SUSP
NASAL | Status: DC
Start: 2009-07-30 — End: 2016-09-20

## 2009-07-30 NOTE — Telephone Encounter (Signed)
Medication Refill Documentation    Name of Medication: Flonase    Prescribing provider:  Romelle Starcher, MD    Protocol used (by medication type, not necessarily patient diagnosis): Adult:  ALLERGY/NASAL SPRAY -  OK to refill if seen in last 12 months.    Date last filled: 05/07/08 per pharmacy    Date last seen for this issue:  12/09      Last 2 Encounter BP Readings:   Date BP   05/24/2009 160/90   01/26/2009 122/72           Next scheduled appointment date:  Visit date not found

## 2009-08-06 ENCOUNTER — Telehealth (INDEPENDENT_AMBULATORY_CARE_PROVIDER_SITE_OTHER): Payer: Self-pay | Admitting: Internal Medicine

## 2009-08-06 ENCOUNTER — Ambulatory Visit (INDEPENDENT_AMBULATORY_CARE_PROVIDER_SITE_OTHER): Payer: PPO | Admitting: Internal Medicine

## 2009-08-06 ENCOUNTER — Encounter (INDEPENDENT_AMBULATORY_CARE_PROVIDER_SITE_OTHER): Payer: Self-pay | Admitting: Physician Assistant

## 2009-08-06 ENCOUNTER — Other Ambulatory Visit (INDEPENDENT_AMBULATORY_CARE_PROVIDER_SITE_OTHER): Payer: Self-pay | Admitting: Physician Assistant

## 2009-08-06 ENCOUNTER — Ambulatory Visit (INDEPENDENT_AMBULATORY_CARE_PROVIDER_SITE_OTHER): Payer: PPO | Admitting: Physician Assistant

## 2009-08-06 VITALS — BP 124/88 | HR 96 | Temp 98.3°F | Resp 12 | Wt 176.0 lb

## 2009-08-06 MED ORDER — FLOVENT HFA 110 MCG/ACT IN AERO
INHALATION_SPRAY | RESPIRATORY_TRACT | Status: DC
Start: 2009-08-06 — End: 2015-05-29

## 2009-08-06 MED ORDER — ALBUTEROL 90 MCG/ACT IN AERS
INHALATION_SPRAY | RESPIRATORY_TRACT | Status: DC
Start: 2009-08-06 — End: 2011-02-24

## 2009-08-06 MED ORDER — AZITHROMYCIN 250 MG OR TABS
ORAL_TABLET | ORAL | Status: DC
Start: 2009-08-06 — End: 2011-02-24

## 2009-08-06 NOTE — Progress Notes (Signed)
Connie [name] is a 48 year old female  established patient who presents with the following symptoms:     1.  Chief Complaint   Patient presents with   . Sinus Problem     Pt c/o allergies that creates sinus problems and then drains into lungs/chest.  Has Hx of this.  This Episode:  x7days.     Got back from vacation last Friday, felt was allergic reaction though on Allegra, bad sinus congestion then evened out and now staying about the same, green drainage,coughing post-nasal drainage. Expectorant helping, No Albuterol in the last 24 hours. Not using Flovent . Usually twice a year fill of Albuterol and allergy/viral infections. Last December took the antibiotics given.    CONSTITUTIONAL: denies fever and chills or myalgias, some fatigue  NEURO: headache: yes  EYES: not itchy,a little sore  EARS: little congested  SINUSES: sinus congestion, sinus pressure  NOSE: colored rhinorrhea  THROAT/NECK:ST improved  CHEST: productive cough with sputum described as colored, mildly congested/tight/SOB  GI: none  SKIN: rash:  NO       Currently using OTC product(s): YES: expectorant          OBJECTIVE:  Blood pressure 124/88, pulse 96, temperature 98.3 F (36.8 C), temperature source Oral, resp. rate 12, weight 176 lb (79.833 kg), last menstrual period 07/20/2009, SpO2 98%.  GEN: no acute distress, cooperative, pleasant, sounds congested  EYES: lids appear normal, no swelling or erythema, PERRLA, EOM's intact, sclerae white and conjunctiva clear  EARS: external ear, canal and TM normal  NOSE: nares patent, no rhinorrhea, lavender, enlarged turbinates with clear secretions  SINUSES: nontender  THROAT: moist mucus membranes, without lesions, no erythema, without exudates or tonsillar hypertrophy and post nasal drainage absent  NECK: supple and no adenopathy  LUNGS: minimal wheezes with forced expiration  SKIN: no rash    ASSESSMENT/PLAN:  465.9 ACUTE URI NOS  (primary encounter diagnosis)  Plan: Most likely still viral illness.  Discussed possibility of early  secondary infection starting and may need to start ABX course if worsening in the next week. Symptomatic treatment with fluids, nasal saline/lavage rest, OTC analgesics/cold medications PRN. ABX script given to hold on to and will start should worsening symptoms begin. Reviewed possible side effects if does take ABX and still will need to recheck in clinic  for persistence, worsening, appearance of new symptoms.    493.90 UNSP ASTHMA W/O STATUS ASTHMATICUS  Plan: FLOVENT HFA 110 MCG/ACT IN AERO, ALBUTEROL 90 MCG/ACT IN AERS, FLU VACCINE, 3 YRS & >, IM,   Asthma Rules of 2: use of inhaler more than twice per week, awaking from sleep more than twice per month, or having to refill Albuterol more than twice per year.   Restart flovent for 3-4 weeks, BID to QD pending how doing, albuterol PRN    477.9 ALLERGIC RHINITIS NOS  Plan: continue flonase

## 2009-08-06 NOTE — Telephone Encounter (Signed)
Triage Nurse Telephone Encounter  Chief Complaint: sinus problems  Reviewed Problem List, Medications and Allergies: YES    Description of symptoms: Pt states she is having coughing, green sinus drainage,    using Albuterol 2 times/ day, and flonase.  Pt has also been taking decongestants, expectorant (Mucinex) and Zyrtec.  Now moving down to chest.      ROS: negative per protocol except as noted in history  Protocol used for assessment: sinus problems  Recommended disposition: See provider within 24 hours  Caller agrees:  YES    PLAN: Appointed today @ 3:15 with Freddie Breech  Home care instructions provided:  YES   Instructed to call back or seek tx if sxs worsen or has new concerns: YES   Caller understands:  YES  Follow up call needed  NO  Reference used: [employer] 3rd edition, Telephone Triage Protocols for Nurses

## 2009-08-07 ENCOUNTER — Other Ambulatory Visit (INDEPENDENT_AMBULATORY_CARE_PROVIDER_SITE_OTHER): Payer: Self-pay | Admitting: Physician Assistant

## 2009-08-19 NOTE — Progress Notes (Addendum)
Addended by: BURNS, Meriam Sprague A on: 08/19/2009      Modules accepted: Orders

## 2009-12-21 ENCOUNTER — Other Ambulatory Visit (INDEPENDENT_AMBULATORY_CARE_PROVIDER_SITE_OTHER): Payer: Self-pay | Admitting: Internal Medicine

## 2010-01-12 ENCOUNTER — Telehealth (INDEPENDENT_AMBULATORY_CARE_PROVIDER_SITE_OTHER): Payer: Self-pay | Admitting: Internal Medicine

## 2010-01-12 MED ORDER — ZOLPIDEM TARTRATE 10 MG OR TABS
ORAL_TABLET | ORAL | Status: DC
Start: 2010-01-12 — End: 2010-07-08

## 2010-01-12 NOTE — Telephone Encounter (Signed)
rf approved.  Please phone in.

## 2010-01-12 NOTE — Telephone Encounter (Signed)
Medication Refill Documentation    Name of Medication: Ambien    Prescribing provider:  Romelle Starcher, MD    Protocol used (by medication type, not necessarily patient diagnosis): Adult:  CONTROLLED SUBSTANCES - Do not refill:  Send to provider as Rx Auth.    Date last filled: 2/11    Date last seen for this issue:  3/10      Last 2 Encounter BP Readings:   Date BP   08/06/2009 124/88   05/24/2009 160/90           Next scheduled appointment date:  Visit date not found

## 2010-01-13 NOTE — Telephone Encounter (Signed)
Pharmacy not open-Ambien called into [facility] recording.

## 2010-05-25 ENCOUNTER — Ambulatory Visit: Payer: Self-pay | Admitting: Unknown Physician Specialty

## 2010-07-08 ENCOUNTER — Other Ambulatory Visit (INDEPENDENT_AMBULATORY_CARE_PROVIDER_SITE_OTHER): Payer: Self-pay | Admitting: Internal Medicine

## 2010-07-08 MED ORDER — ZOLPIDEM TARTRATE 10 MG OR TABS
ORAL_TABLET | ORAL | Status: DC
Start: 2010-07-08 — End: 2011-01-16

## 2010-07-08 NOTE — Telephone Encounter (Signed)
Phoned in. Pt informed

## 2010-07-08 NOTE — Telephone Encounter (Signed)
Medication Refill Documentation    Name of Medication: Zolpidem    Prescribing provider:  Romelle Starcher, MD    Protocol used (by medication type, not necessarily patient diagnosis): Adult:  CONTROLLED SUBSTANCES - Do not refill:  Send to provider as Rx Auth.    Date last filled: 01/12/10 x 6 months    Date last seen for this issue:  08/06/09 LOV      Last 2 Encounter BP Readings:   Date BP   08/06/2009 124/88   05/24/2009 160/90           Next scheduled appointment date:  Visit date not found

## 2010-07-19 ENCOUNTER — Other Ambulatory Visit (INDEPENDENT_AMBULATORY_CARE_PROVIDER_SITE_OTHER): Payer: Self-pay | Admitting: Internal Medicine

## 2011-01-16 ENCOUNTER — Other Ambulatory Visit (INDEPENDENT_AMBULATORY_CARE_PROVIDER_SITE_OTHER): Payer: Self-pay | Admitting: Internal Medicine

## 2011-01-16 MED ORDER — ZOLPIDEM TARTRATE 10 MG OR TABS
ORAL_TABLET | ORAL | Status: DC
Start: 2011-01-16 — End: 2011-02-13

## 2011-01-16 NOTE — Telephone Encounter (Signed)
rf called in

## 2011-01-16 NOTE — Telephone Encounter (Signed)
Medication Refill Documentation    Name of Medication: Zolpidem Tartrate (AMBIEN) 10 MG       Prescribing provider:  Romelle Starcher, MD    Protocol used (by medication type, not necessarily patient diagnosis): Adult:  CONTROLLED SUBSTANCES - Do not refill:  Send to provider as Rx Auth.    Date last filled: 07-08-10    Date last seen for this issue:  01-26-09      BP Readings from Last 2 Encounters:   08/06/09 124/88   05/24/09 160/90       Next scheduled appointment date:  Visit date not found

## 2011-01-16 NOTE — Telephone Encounter (Signed)
Please phone in Rx

## 2011-02-02 ENCOUNTER — Other Ambulatory Visit (INDEPENDENT_AMBULATORY_CARE_PROVIDER_SITE_OTHER): Payer: Self-pay | Admitting: Internal Medicine

## 2011-02-02 ENCOUNTER — Other Ambulatory Visit (HOSPITAL_BASED_OUTPATIENT_CLINIC_OR_DEPARTMENT_OTHER): Payer: Self-pay

## 2011-02-13 ENCOUNTER — Other Ambulatory Visit (INDEPENDENT_AMBULATORY_CARE_PROVIDER_SITE_OTHER): Payer: Self-pay | Admitting: Internal Medicine

## 2011-02-13 ENCOUNTER — Telehealth (INDEPENDENT_AMBULATORY_CARE_PROVIDER_SITE_OTHER): Payer: Self-pay | Admitting: Internal Medicine

## 2011-02-13 NOTE — Telephone Encounter (Signed)
Will re-route to PCP

## 2011-02-13 NOTE — Telephone Encounter (Signed)
Patient states she usually receives a 6 month supply, and only received a 1 month supply the last time she had prescription filled. Please advise. Thank you.

## 2011-02-13 NOTE — Telephone Encounter (Signed)
Medication Refill Documentation    Name of Medication: Zolpidem tartrate    Prescribing provider:  Romelle Starcher, MD    Protocol used (by medication type, not necessarily patient diagnosis): Adult:  CONTROLLED SUBSTANCES - Do not refill:  Send to provider as Rx Auth.    Date last filled: 01/16/11, #[id number]    Date last seen for this issue:  08/06/09 LOV      BP Readings from Last 2 Encounters:   08/06/09 124/88   05/24/09 160/90       Next scheduled appointment date:  Visit date not found

## 2011-02-13 NOTE — Telephone Encounter (Signed)
Needs appt with Dr. Festus Aloe

## 2011-02-14 MED ORDER — ZOLPIDEM TARTRATE 10 MG OR TABS
ORAL_TABLET | ORAL | Status: DC
Start: 2011-02-13 — End: 2011-02-24

## 2011-02-14 NOTE — Telephone Encounter (Signed)
VM left for Pt.to cb.  CCR please give Pt.MD's message below, thanks.    Ambien was called into [facility] recording.

## 2011-02-14 NOTE — Telephone Encounter (Signed)
rf approved.  Please phone in.  Please have her schedule for followup or HME.

## 2011-02-15 NOTE — Telephone Encounter (Signed)
Pt returned call, CCR informed pt that Rx has been approved. Pt has scheduled an appointment with Connie [name], Connie Linsey, MD for a Wellness exam and wanted to know if that is what Connie [name], Connie Linsey, MD wanted her to do. Please call pt to advise, detailed msg ok.

## 2011-02-15 NOTE — Telephone Encounter (Signed)
MD's message was repeated and left on VM.

## 2011-02-24 ENCOUNTER — Encounter (INDEPENDENT_AMBULATORY_CARE_PROVIDER_SITE_OTHER): Payer: Self-pay | Admitting: Internal Medicine

## 2011-02-24 ENCOUNTER — Other Ambulatory Visit (HOSPITAL_BASED_OUTPATIENT_CLINIC_OR_DEPARTMENT_OTHER)
Admit: 2011-02-24 | Discharge: 2011-02-24 | Disposition: A | Discharge status: Home / Self Care | Payer: BLUE CROSS/BLUE SHIELD | Attending: Internal Medicine | Admitting: Internal Medicine

## 2011-02-24 ENCOUNTER — Ambulatory Visit (INDEPENDENT_AMBULATORY_CARE_PROVIDER_SITE_OTHER): Payer: BLUE CROSS/BLUE SHIELD | Admitting: Internal Medicine

## 2011-02-24 VITALS — BP 136/90 | HR 82 | Temp 97.5°F | Resp 12 | Ht 60.0 in | Wt 175.0 lb

## 2011-02-24 DIAGNOSIS — Z01419 Encounter for gynecological examination (general) (routine) without abnormal findings: Secondary | ICD-10-CM | POA: Insufficient documentation

## 2011-02-24 DIAGNOSIS — Z1151 Encounter for screening for human papillomavirus (HPV): Secondary | ICD-10-CM | POA: Insufficient documentation

## 2011-02-24 MED ORDER — ZOLPIDEM TARTRATE 10 MG OR TABS
ORAL_TABLET | ORAL | Status: DC
Start: 2011-02-24 — End: 2011-09-11

## 2011-02-24 MED ORDER — ALBUTEROL 90 MCG/ACT IN AERS
INHALATION_SPRAY | RESPIRATORY_TRACT | Status: DC
Start: 2011-02-24 — End: 2013-03-12

## 2011-02-24 MED ORDER — METRONIDAZOLE 1 % EX GEL
CUTANEOUS | Status: DC
Start: 2011-02-24 — End: 2012-06-17

## 2011-02-24 NOTE — Progress Notes (Signed)
Connie [name] is a 50 year old female here for an annual health maintenance exam.     PREVIOUSLY ENTERED HISTORICAL INFORMATION:    Patient Active Problem List   Diagnoses Date Noted   . UNSP ASTHMA W/O STATUS ASTHMATICUS 09/23/2004     Priority: High   . ALLERGIC RHINITIS NOS 05/24/2004     Priority: High   . PROCREATIVE MGMT-COUNSEL 09/16/2000     Priority: High   . H/O SPON ABORT x 2      Priority: High       Past Medical History   Diagnosis Date   . NO SIGNIFICANT PAST MEDICAL HX        Past Surgical History   Procedure Date   . No prior surgeries        Obstetric History    G4   P1   T1   P0   A0   TAB0   SAB3   E0   M0   L1    had a w/u for her SAbs; had a successful pregnancy thereafter while on progest       Family History   Problem Relation Age of Onset   . Heart disease Father    . Lipids Mother    . Other Father      Crohns       Current Outpatient Prescriptions   Medication Sig   . AEROCHAMBER MV MISC to use with Albuterol MDI   . Albuterol 90 MCG/ACT Inhalation Aero Soln Inhale 2 puffs every 4 to 6 hours as needed   . Fluticasone Propionate  HFA (FLOVENT HFA) 110 MCG/ACT IN AERO Inhale 1 puff by mouth twice daily regardless of symptoms, rinse mouth after use   . FLUTICASONE PROPIONATE, NASAL, (FLONASE) 50 MCG/ACT NA SUSP 1 spray to each nostril twice daily regardless of symptoms   . Ibuprofen (ADVIL OR) None Entered   . MetroNIDAZOLE (METROGEL) 1 % External Gel apply twice per day   . Zolpidem Tartrate (AMBIEN) 10 MG Oral Tab Take 1 tablet by mouth at bedtime as needed for insomnia   . ZYRTEC 10 MG OR TABS 1 PO QD        History     Social History   . Marital Status: Married     Spouse Name: N/A     Number of Children: N/A   . Years of Education: N/A     Occupational History   . Not on file.     Social History Main Topics   . Smoking status: Former Smoker -- 0.5 packs/day for 10 years     Types: Cigarettes     Quit date: 08/27/1989   . Smokeless tobacco: Never Used   . Alcohol Use: Yes      1-2  every coulpe of weeks   . Drug Use: No   . Sexually Active: Yes -- Female partner(s)      mutually mongamous since 1987     Other Topics Concern   . Exercise No     Social History Narrative    Mom for 1 child, husband is a Administrator.  Has lived in [place] since 1993. Pt used to do glass work            PHYSICAL EXAM:    General: healthy, alert, no distress  Skin: Skin color, texture, turgor normal. No rashes or concerning lesions  Head: Normocephalic. No masses, lesions, tenderness or abnormalities  Eyes: Lids/periorbital skin normal, Conjunctivae/corneas  clear, PERRL, EOM's intact  Ears: External ears normal. Canals clear. TM's normal.  Nose:normal  Oropharynx: Lips, mucosa, and tongue normal. Teeth and gums normal., posterior pharynx without erythema or drainage  Neck: supple. No adenopathy. Thyroid symmetric, normal size, without nodules  Lungs: clear to auscultation  Heart: normal rate, regular rhythm and no murmurs, clicks, or gallops  Breasts: No obvious deformity or mass to inspection, nipples everted bilaterally, no skin lesion or nipple discharge, no mass palpated, no axillary lymphadenopathy  Abd: soft, non-tender. BS normal. No masses or organomegaly  GU: normal bartholin/skene/urethral meatus/anus., Vagina is rugated and well-estrogenized, cervix normal in appearance, no CMT, no bladder tenderness, uterus normal size, shape, and consistency, no adnexal masses or tenderness  Rectal: sphincter tone normal and no mass  Ext: Normal, without deformities, edema, or skin discoloration, radial and DP pulses 2+ bilaterally  Neuro: grossly normal       ASSESSMENT AND PLAN:    1. Routine screening:    : PAP SMEAR (CYTOPATH, GYN)-[facility], VITAMIN D (25         HYDROXY)         REFERRAL TO SCREENING COLONOSCOPY      LIPID PANEL              2. Immunizations today:         Immunization History   Administered Date(s) Administered   . Hepatitis A 08/22/2002, 08/01/2006   . IPV ([place]) 08/22/2002   . Influenza 09/25/2005    . MMR 08/22/2002   . PPD 12/31/2001   . Pandemic Influenza 10/30/2008   . Td (Adult) 12/31/2001   . Typhoid 08/01/2006   . Yellow Fever 08/01/2006     3. Preventive counseling: importance of regular dental care, healthy dietary guidelines, adequate (1200-1500mg /d) calcium intake, proper exercise, vitamin D supplementation       ==========================================================================  The following represents signficant issues addressed separately at this visit.        454.9 ASYMPTOMATIC VARICOSE VEINS  She complains of enlarging varicosity at R calf that causes some pain.    727.1 BUNION  She has very old orthotics to control her bunion symptoms and requests  Referral for new orthotics    493.90 UNSP ASTHMA W/O STATUS ASTHMATICUS  This is stable. She remains off ics until her seasonal allergies in the summer. .She requires a refill of medications.    723.1 CERVICALGIA  She denies radicular pain, motor or sensory impairment.  She has had chronic tightness in neck and rhomboids, She has had improvement with stretching and massage, she wonders if she needs an MRI.        389.9 HEARING LOSS NOS  She complains of hearing loss, previously identified, he feels this is worse.    695.3 ROSACEA  She complains of red facial rash with occasional pustule formation.    692.9 DERMATITIS NOS  She complains of dry patch at sacrum.    780.52 INSOMNIA NEC    She requires a refill of medications.  We reviewed her daily use and risks.    PE:  BP 136/90  Pulse 82  Temp(Src) 97.5 F (36.4 C) (Temporal)  Resp 12  Ht 5' (1.524 m)  Wt 175 lb (79.379 kg)  BMI 34.18 kg/m2  LMP 02/02/2011    General appearance: Healthy.  Skin: Normal.  Mental Status: cooperative, normal affect, no gross thought process defects.  Neck and back: Normal configuration.  Full rom at neck.  Motor exam +5 /5 throughout upper ext.  No boney tenderness    Xerosis is noted in sacral area.  Acne rosacea is noted on the face  External ears and  canals are normal. TM's clear with normal light reflex.  Chest is clear, no wheezing or rales. Normal symmetric air entry throughout both lung fields. No chest wall deformities or tenderness.  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 Given that her symptoms are muscular without radicular symptoms I advised against further imaging such as MRI.  Plan: Continue with stretches and strengthening      389.9 HEARING LOSS NOS  Comment:   Plan: REFERRAL TO AUDIOLOGY            695.3 ROSACEA  Comment:   Plan: MetroNIDAZOLE (METROGEL) 1 % External Gel        follow up if not resolved.     692.9 DERMATITIS NOS  Comment: encouraged use of moisturizers  Plan:     780.52 INSOMNIA NEC  Comment: refill medications  Plan: Zolpidem Tartrate (AMBIEN) 10 MG Oral Tab

## 2011-02-24 NOTE — Patient Instructions (Signed)
Please take calcium 1500 mg by mouth daily in divided doses.    Please take vitamin D3 [phone] iu per day.     Please check your bp 2-3 x per week for the next few weeks. See me if your bp is over 130/85.

## 2011-02-27 ENCOUNTER — Other Ambulatory Visit (INDEPENDENT_AMBULATORY_CARE_PROVIDER_SITE_OTHER): Payer: BLUE CROSS/BLUE SHIELD

## 2011-02-27 NOTE — Progress Notes (Signed)
Addended by: Theresa Duty on: 02/27/2011 10:44 AM     Modules accepted: Orders

## 2011-03-02 ENCOUNTER — Ambulatory Visit (HOSPITAL_BASED_OUTPATIENT_CLINIC_OR_DEPARTMENT_OTHER): Payer: BLUE CROSS/BLUE SHIELD | Attending: Internal Medicine | Admitting: Audiologist

## 2011-03-02 DIAGNOSIS — H903 Sensorineural hearing loss, bilateral: Secondary | ICD-10-CM | POA: Insufficient documentation

## 2011-03-02 LAB — CERVICAL CANCER SCREENING: Cytologic Impression: NEGATIVE

## 2011-03-02 LAB — HPV ONLY

## 2011-03-10 ENCOUNTER — Encounter (INDEPENDENT_AMBULATORY_CARE_PROVIDER_SITE_OTHER): Payer: Self-pay | Admitting: Physician Assistant

## 2011-04-10 ENCOUNTER — Ambulatory Visit: Payer: BLUE CROSS/BLUE SHIELD | Attending: Gastroenterology | Admitting: Gastroenterology

## 2011-05-04 ENCOUNTER — Telehealth (INDEPENDENT_AMBULATORY_CARE_PROVIDER_SITE_OTHER): Payer: Self-pay | Admitting: Internal Medicine

## 2011-05-04 NOTE — Telephone Encounter (Signed)
Okay for early refill of ambien

## 2011-05-04 NOTE — Telephone Encounter (Signed)
Please review-Pt.has refills available but just an early fill.  LOV 3/12.

## 2011-05-04 NOTE — Telephone Encounter (Signed)
CONFIRMED PHONE NUMBER: [phone]  CALLERS FIRST AND LAST NAME: Vincenza Hews NAME: [facility] TITLE: Pharmacist  CALLERS RELATIONSHIP:OTHER: Pharmacy  RETURN CALL: OK to leave detailed message with anyone that answers    SUBJECT: General Message   REASON FOR REQUEST: Sent [place] a fax about an hour ago. Patient has a prescription for Ambien to be filled 2 weeks early because she is leaving town for 1 month, and needs ok to fill it early    MESSAGE: Please call back  FORWARD TO: Dr Julianne Rice

## 2011-05-04 NOTE — Telephone Encounter (Signed)
Pharmacy informed.

## 2011-06-09 ENCOUNTER — Encounter (INDEPENDENT_AMBULATORY_CARE_PROVIDER_SITE_OTHER): Payer: Self-pay | Admitting: Family Medicine

## 2011-06-12 ENCOUNTER — Telehealth (INDEPENDENT_AMBULATORY_CARE_PROVIDER_SITE_OTHER): Payer: Self-pay | Admitting: Internal Medicine

## 2011-06-12 ENCOUNTER — Ambulatory Visit (INDEPENDENT_AMBULATORY_CARE_PROVIDER_SITE_OTHER): Payer: Self-pay | Admitting: Internal Medicine

## 2011-06-12 NOTE — Telephone Encounter (Signed)
To PSR, RN advice was to be seen within 24 hours please assist pt in rescheduling within that time window.

## 2011-06-12 NOTE — Telephone Encounter (Signed)
I see that pt has already scheduled. This is fine, disposition was to be seen in 24hrs. Closing TE

## 2011-06-12 NOTE — Telephone Encounter (Signed)
Triage Nurse Telephone Encounter  Chief Complaint: numbness  Reviewed Problem List, Medications and Allergies: YES    Description of symptoms:     Per pt: "Right arm, lower arm elbow to hand is experiencing numbness. For the last couple of days, feels like a vague numb/tingle like after you've had pins and needles."    Lots of yard work, Animator work, driving, pretty active but not really out of the ordinary,     No HA, no chest discomfort/pain, no redness, warmth, swelling, or other symptoms.    Has been out in [facility], has been scratched/bitten but nothing is on that arm this is just a note.    Notices it more in the morning when lying down, then when using the computer.    ROS: negative per protocol except as noted in history  Protocol used for assessment: numbness/tingling  Recommended disposition: See provider within 24 hours  Caller agrees:  YES    PLAN: OV today to evaluate, will seek urgent care if symptoms suddenly worsen/change  Home care instructions provided:  YES   Instructed to call back or seek tx if sxs worsen or has new concerns: YES   Caller understands:  YES  Follow up call needed  NO  Reference used: [employer] 3rd edition, Telephone Triage Protocols for Nurses

## 2011-06-12 NOTE — Telephone Encounter (Signed)
CONFIRMED PHONE NUMBER: [phone]  After 1pm [phone]  CALLERS FIRST AND LAST NAME: Dyane Dustman  FACILITY NAME: na TITLE: na  CALLERS RELATIONSHIP:Self  RETURN CALL: Detailed message on voicemail only    SUBJECT: General Message   REASON FOR REQUEST: Patient called earlier for a RN Triage, and was given an appointment for today 07/16. She would like to know if it is ok to reschedule her appointment for tomorrow instead.    MESSAGE: Patient would like to know if it is ok to reschedule the appointment she made for today, for tomorrow instead, She is unable to come in today. She already rescheduled appointment assuming that it is OK to wait.  FORWARD TO: RN advice

## 2011-06-13 ENCOUNTER — Ambulatory Visit (INDEPENDENT_AMBULATORY_CARE_PROVIDER_SITE_OTHER): Payer: BLUE CROSS/BLUE SHIELD | Admitting: Hematology & Oncology

## 2011-06-22 ENCOUNTER — Other Ambulatory Visit (INDEPENDENT_AMBULATORY_CARE_PROVIDER_SITE_OTHER): Payer: BLUE CROSS/BLUE SHIELD

## 2011-07-17 ENCOUNTER — Telehealth (INDEPENDENT_AMBULATORY_CARE_PROVIDER_SITE_OTHER): Payer: Self-pay | Admitting: Internal Medicine

## 2011-07-17 NOTE — Telephone Encounter (Signed)
CONFIRMED PHONE NUMBER: [phone]  CALLERS FIRST AND LAST NAME: Dyane Dustman  FACILITY NAME: na TITLE: na  CALLERS RELATIONSHIP:Self  RETURN CALL: Detailed message on voicemail only    SUBJECT: General Message   REASON FOR REQUEST: Lab test question    MESSAGE: Patient would like to know if she can drink coffee before her fasting blood draw.   FORWARD TO: Dr. Festus Aloe

## 2011-07-17 NOTE — Telephone Encounter (Signed)
Advised can have black coffee, no cream no sugar

## 2011-07-28 ENCOUNTER — Other Ambulatory Visit (INDEPENDENT_AMBULATORY_CARE_PROVIDER_SITE_OTHER): Payer: BLUE CROSS/BLUE SHIELD

## 2011-07-29 HISTORY — PX: BACK SURGERY: SHX140

## 2011-07-30 ENCOUNTER — Ambulatory Visit: Payer: Self-pay | Admitting: Sports Medicine

## 2011-08-01 ENCOUNTER — Ambulatory Visit (INDEPENDENT_AMBULATORY_CARE_PROVIDER_SITE_OTHER): Payer: BLUE CROSS/BLUE SHIELD | Admitting: Internal Medicine

## 2011-08-01 VITALS — BP 122/90 | HR 80 | Temp 98.8°F | Resp 12 | Wt 170.0 lb

## 2011-08-01 MED ORDER — TERBINAFINE HCL 1 % EX CREA
TOPICAL_CREAM | CUTANEOUS | Status: DC
Start: 2011-08-01 — End: 2012-06-17

## 2011-08-01 NOTE — Patient Instructions (Signed)
Use over the counter  clotrimazole or terbinafine twice per day for 3 weeks.

## 2011-08-01 NOTE — Progress Notes (Signed)
Pt.c/o rash>right inner thigh and across abd.x57mo.,itching off et on, used Hydrocortisone w/out results.    Patient/Parent has reviewed the appropriate VIS and has all questions answered? YES    Vaccine given today without initial adverse effect. YES    Lupe,MA

## 2011-08-02 NOTE — Progress Notes (Signed)
This  patient, Connie [name] is a 50 year old year old female who presents with the following:    692.9 Dermatitis  (primary encounter diagnosis)  She complains of R inguinal and abdominal fold rash x 2 weeks .nth improvement with over the counter moisturizer or HC1%.    V06.1 Need for prophylactic vaccination with combined diphtheria-tetanus-pertussis (DTP) vaccine  This is due.Marland Kitchen    PE: WD female in NAD, alert and  cooperative  BP 122/90  Pulse 80  Temp 98.8 F (37.1 C)  Resp 12  Wt 170 lb (77.111 kg)  interiginous rash at lower abdominal fold and R inguinal area  A/P  692.9 Dermatitis  (primary encounter diagnosis)  Comment: See patient instructions   Plan:     V06.1 Need for prophylactic vaccination with combined diphtheria-tetanus-pertussis (DTP) vaccine  Comment:   Plan: TDAP VACC, >7 YRS, 0.5 ML/IM            This note was generated in part using voice recognition software. Although every effort is made to edit the content, transcription errors may occur.

## 2011-08-03 ENCOUNTER — Ambulatory Visit: Payer: Self-pay | Admitting: Unknown Physician Specialty

## 2011-08-14 ENCOUNTER — Ambulatory Visit: Payer: BLUE CROSS/BLUE SHIELD | Attending: Registered Nurse | Admitting: Registered Nurse

## 2011-08-14 DIAGNOSIS — I83893 Varicose veins of bilateral lower extremities with other complications: Secondary | ICD-10-CM | POA: Insufficient documentation

## 2011-08-24 ENCOUNTER — Telehealth (INDEPENDENT_AMBULATORY_CARE_PROVIDER_SITE_OTHER): Payer: Self-pay | Admitting: Internal Medicine

## 2011-08-24 MED ORDER — FLUCONAZOLE 150 MG OR TABS
ORAL_TABLET | ORAL | Status: AC
Start: 2011-08-24 — End: 2011-08-25

## 2011-08-24 NOTE — Telephone Encounter (Signed)
Pt saw PCP on 08/01/11, directed to try OTC medication below x 3 weeks. Has not seen improvement. Requesting PCP advise on next steps. Will await c/b when Dr.Ferrucci is back in clinic tomorrow.

## 2011-08-24 NOTE — Telephone Encounter (Signed)
Please have her try fluconazole 150 mg by mouth every weeks x 3. We will have her see dermatology if not resolved.  Please fax to her chosen pharmacy.

## 2011-08-24 NOTE — Telephone Encounter (Signed)
Pt returning Clinic's call. CCR relayed message below, and loaded Pt's pharmacy. Pt wants to know if she should continue the Terbinafine Hydrochloride with the fluconazole 150 mg, or just the Terbinafine Hydrochloride. Pt also wanted to advise the Clinic that it would be OK to leave detailed messages on her vm in the future.

## 2011-08-24 NOTE — Telephone Encounter (Signed)
Dr.Ferrucci is not in the clinic today, forwarded to RN advice pool to review.

## 2011-08-24 NOTE — Telephone Encounter (Signed)
Pt wants to know if she should continue the Lamisil cream along with taking the Fluconazole 150 MG Oral Tablets  Forwarded to provider to review.

## 2011-08-24 NOTE — Telephone Encounter (Signed)
CONFIRMED PHONE NUMBER: [phone]  CALLERS FIRST AND LAST NAME: Dyane Dustman  FACILITY NAME: n/a TITLE: n/a  CALLERS RELATIONSHIP:Self  RETURN CALL: General message on voicemail only    SUBJECT: Prescription Management   REASON FOR REQUEST: Medication question     MEDICATION: Terbinafine Hydrochloride  CONCERN/QUESTION: Patient states that medication listed above is not working and would like to discuss with Ferrucci, Oval Linsey, MD.   PRESCRIBING PROVIDER: [name], MD  DOSE TAKING NOW: unknown  PHARMACY NAME, LOCATION, & PHONE #: n/a

## 2011-08-24 NOTE — Telephone Encounter (Signed)
LMtcb.  Please verify pharm & inform pt. Of PCP message below.

## 2011-08-25 NOTE — Telephone Encounter (Signed)
Pt.was informed, she will f/u prn.

## 2011-08-25 NOTE — Telephone Encounter (Signed)
The topical lamisil is not necessary.

## 2011-09-07 ENCOUNTER — Other Ambulatory Visit: Payer: Self-pay

## 2011-09-11 ENCOUNTER — Other Ambulatory Visit (INDEPENDENT_AMBULATORY_CARE_PROVIDER_SITE_OTHER): Payer: Self-pay | Admitting: Internal Medicine

## 2011-09-11 ENCOUNTER — Telehealth (INDEPENDENT_AMBULATORY_CARE_PROVIDER_SITE_OTHER): Payer: Self-pay | Admitting: Internal Medicine

## 2011-09-11 NOTE — Telephone Encounter (Signed)
Medication Refill Documentation    Name of Medication: Zolpidem    Prescribing provider:  Romelle Starcher, MD    Protocol used (by medication type, not necessarily patient diagnosis): Adult:  CONTROLLED SUBSTANCES - Do not refill:  Send to provider as Rx Auth.    Date last filled: 02/24/11    Date last seen for this issue:  08/01/11      BP Readings from Last 2 Encounters:   08/01/11 122/90   02/24/11 136/90       Next scheduled appointment date:  Visit date not found

## 2011-09-11 NOTE — Telephone Encounter (Signed)
CONFIRMED PHONE NUMBER:  [phone]  CALLERS FIRST AND LAST NAME: Dyane Dustman    FACILITY NAME:   TITLE:    CALLERS RELATIONSHIP:Self  RETURN CALL: General message OK    SUBJECT: General Message   REASON FOR REQUEST:  Pt asking nurse to call her about the getting a different med for a rash, the Fluconazole 150 mg did not work.    MESSAGE: pls return call asap

## 2011-09-11 NOTE — Telephone Encounter (Signed)
Routing to RN advice pool.

## 2011-09-12 MED ORDER — ZOLPIDEM TARTRATE 10 MG OR TABS
ORAL_TABLET | ORAL | Status: DC
Start: 2011-09-11 — End: 2012-03-05

## 2011-09-12 NOTE — Telephone Encounter (Signed)
Ambien was called into [facility] recording.

## 2011-09-12 NOTE — Telephone Encounter (Signed)
Please help her schedule to see derm.

## 2011-09-13 NOTE — Telephone Encounter (Signed)
Patient scheduled for end of month.

## 2011-09-26 ENCOUNTER — Ambulatory Visit: Payer: BLUE CROSS/BLUE SHIELD | Attending: Nursing | Admitting: Nursing

## 2011-09-26 DIAGNOSIS — L408 Other psoriasis: Secondary | ICD-10-CM | POA: Insufficient documentation

## 2011-12-25 ENCOUNTER — Other Ambulatory Visit (HOSPITAL_BASED_OUTPATIENT_CLINIC_OR_DEPARTMENT_OTHER): Payer: Self-pay | Admitting: Vascular Surgery

## 2011-12-25 ENCOUNTER — Ambulatory Visit (HOSPITAL_BASED_OUTPATIENT_CLINIC_OR_DEPARTMENT_OTHER): Payer: BLUE CROSS/BLUE SHIELD

## 2011-12-25 ENCOUNTER — Ambulatory Visit: Payer: BLUE CROSS/BLUE SHIELD | Attending: Vascular Surgery | Admitting: Vascular Surgery

## 2011-12-25 DIAGNOSIS — I83893 Varicose veins of bilateral lower extremities with other complications: Secondary | ICD-10-CM | POA: Insufficient documentation

## 2011-12-26 LAB — PR US CW VENOUS DOPPLER

## 2011-12-26 LAB — PR DUP-SCAN XTR VEINS COMPLETE BILATERAL STUDY

## 2012-01-02 ENCOUNTER — Ambulatory Visit: Payer: BLUE CROSS/BLUE SHIELD | Attending: Vascular Surgery | Admitting: Vascular Surgery

## 2012-01-02 DIAGNOSIS — I83893 Varicose veins of bilateral lower extremities with other complications: Secondary | ICD-10-CM | POA: Insufficient documentation

## 2012-02-05 ENCOUNTER — Encounter (INDEPENDENT_AMBULATORY_CARE_PROVIDER_SITE_OTHER): Payer: Self-pay | Admitting: Internal Medicine

## 2012-02-05 ENCOUNTER — Other Ambulatory Visit (HOSPITAL_BASED_OUTPATIENT_CLINIC_OR_DEPARTMENT_OTHER): Payer: Self-pay

## 2012-03-05 ENCOUNTER — Other Ambulatory Visit: Payer: Self-pay | Admitting: Pharmacist

## 2012-03-05 MED ORDER — ZOLPIDEM TARTRATE 10 MG OR TABS
ORAL_TABLET | ORAL | Status: DC
Start: 2012-03-05 — End: 2012-06-26

## 2012-03-05 NOTE — Telephone Encounter (Signed)
Left in the fax box.

## 2012-03-05 NOTE — Telephone Encounter (Signed)
faxed

## 2012-03-05 NOTE — Telephone Encounter (Signed)
Your patient has requested:  Drug: Zolpidem 10mg   Directions: Take 1 tablet by mouth at bedtime as needed for insomnia  Qty: 30  Last filled: 02/04/12  Ph[facility]: [id number]     If the prescription is approved please make sure the signed hard copy is faxed to the requesting pharmacy or called in.   If it is not approved please let the patient & requesting pharmacy know.

## 2012-04-15 ENCOUNTER — Encounter (INDEPENDENT_AMBULATORY_CARE_PROVIDER_SITE_OTHER): Payer: Self-pay | Admitting: Internal Medicine

## 2012-06-13 LAB — HM COLONOSCOPY

## 2012-06-17 ENCOUNTER — Ambulatory Visit (INDEPENDENT_AMBULATORY_CARE_PROVIDER_SITE_OTHER): Payer: BLUE CROSS/BLUE SHIELD | Admitting: Cardiovascular Disease

## 2012-06-17 ENCOUNTER — Encounter (INDEPENDENT_AMBULATORY_CARE_PROVIDER_SITE_OTHER): Payer: Self-pay | Admitting: Cardiovascular Disease

## 2012-06-17 VITALS — BP 121/81 | HR 78 | Temp 98.2°F | Wt 163.0 lb

## 2012-06-17 NOTE — Progress Notes (Signed)
CC: Right ear hearing loss    HPI:  Connie [name] is a 51 year old who presents today due to right hearing loss.  She states that noises through her right ear sound muffled.  This all started last week when was on the [employer] in [employer].  Some water entered her ear canals and she cleaned them out with a Qtip immediately afterwards.  She immediately began to notice muffled sounds through her right ear.  The change in hearing has been unchanged.  She states that has tried to clean out the ear with Qtips since noticing the hearing difficulties.  She denies any difficulties with her left ear.    She denies any sick contacts, congestion, sneezing, cough, tinnitus, dizziness.  She states that she has seasonal allergies but has not had any allergic symptoms this ear.      3 years ago she underwent audiology testing due to subjective hearing loss, her hearing was normal per testing.        Patient Active Problem List   Diagnosis   . PROCREATIVE MGMT-COUNSEL   . H/O SPON ABORT x 2   . ALLERGIC RHINITIS NOS   . UNSP ASTHMA W/O STATUS ASTHMATICUS       Current outpatient prescriptions:  AEROCHAMBER MV MISC, to use with Albuterol MDI, Disp: 1, Rfl: 0;    Albuterol 90 MCG/ACT Inhalation Aero Soln, Inhale 2 puffs every 4 to 6 hours as needed, Disp: 1 Inhaler, Rfl: 1;  Fluticasone Propionate  HFA (FLOVENT HFA) 110 MCG/ACT IN AERO, Inhale 1 puff by mouth twice daily regardless of symptoms, rinse mouth after use, Disp: 1inhaler, Rfl: 1  FLUTICASONE PROPIONATE, NASAL, (FLONASE) 50 MCG/ACT NA SUSP, 1 spray to each nostril twice daily regardless of symptoms, Disp: 1 , Rfl: 11;  prn  Zolpidem Tartrate (AMBIEN) 10 MG Oral Tab, Take 1 tablet by mouth at bedtime as needed for insomnia, Disp: 30 Tab, Rfl: 5;    ZYRTEC 10 MG OR TABS, 1 PO QD, Disp: 30, Rfl: 3 prn    Review of patient's allergies indicates:  No Known Allergies    Review of Systems   Constitutional: Negative for fever, activity change and appetite change.   HENT: Positive  for hearing loss (right). Negative for ear pain, congestion, facial swelling, rhinorrhea, sneezing, postnasal drip, tinnitus and ear discharge.    Eyes: Negative for pain, discharge, redness and itching.   Neurological: Negative for dizziness, facial asymmetry, light-headedness, numbness and headaches.       BP 121/81  Pulse 78  Temp 98.2 F (36.8 C) (Temporal)  Wt 163 lb (73.936 kg)  SpO2 98%      Physical Exam   Constitutional: She is oriented to person, place, and time. She appears well-developed and well-nourished. No distress.   HENT:   Head: Normocephalic and atraumatic.   Right Ear: Tympanic membrane, external ear and ear canal normal. Decreased hearing is noted.   Left Ear: Hearing, tympanic membrane, external ear and ear canal normal.   Nose: Nose normal.   Mouth/Throat: Oropharynx is clear and moist. No oropharyngeal exudate.        Right ear canal with cerumen impaction.  Small window of TM can be seen pre lavage.      Post lavage:    TM clear, no effusion.      Diminished hearing in right ear grossly.    Eyes: Conjunctivae normal are normal. Right eye exhibits no discharge. Left eye exhibits no discharge. No scleral  icterus.   Neurological: She is alert and oriented to person, place, and time.   Skin: Skin is warm and dry. No rash noted. She is not diaphoretic. No erythema. No pallor.   Psychiatric: She has a normal mood and affect. Her behavior is normal. Judgment and thought content normal.       Assessment:  Connie [name] is a 51 year old female who presents today for acute hearing loss.  Her hearing loss is likely conductive as she had cerumen impaction of her affected right ear.  Cerumen was successfully removed today with a lavage.      Right-sided hearing deficit - likely conductive due to cerumen impaction  - EAR WAX LAVAGE  -     If symptoms do not improve over the next couple days, call clinic for a referral to audiology  -     Refrain from using Qtips in the ear.      Dionne Ano,  MD  Internal Medicine, R1  Pager: [phone]    Supervised by Dr. Mertie Clause

## 2012-06-17 NOTE — Progress Notes (Signed)
Ear Lavage Procedure Documentation:   After written order from provider, I irrigated the patient's ear(s) using the Elephant Ear Wash system and a solution of 1 part 3% hydrogen peroxide and 3 parts body temperature (warm) water.    Which side did you treat? Right-sided     Did you put a softening agent in? (if yes, indicate the agent used): No     Did you visualize the eardrum afterwards? yes     Did patient tolerate the procedure well? (if no, indicate any problems): Yes

## 2012-06-17 NOTE — Patient Instructions (Addendum)
-  If your symptoms of hearing loss do not improve, call the clinic and we will refer you to audiology  -Do not use Qtips to clear your ears.  Use gentle warm water to flush and let water drain out.

## 2012-06-19 NOTE — Progress Notes (Signed)
-------------------------------------------    Attending: Kadee Philyaw Robert Mickell Birdwell, MD  I saw and evaluated the patient with the resident and agree with Dr. Chang's note.   I discussed the case with the resident, and have reviewed the resident's note.  I agree with the findings as discussed.   ----------------------------------------

## 2012-06-26 ENCOUNTER — Other Ambulatory Visit (INDEPENDENT_AMBULATORY_CARE_PROVIDER_SITE_OTHER): Payer: Self-pay | Admitting: Internal Medicine

## 2012-06-26 MED ORDER — ZOLPIDEM TARTRATE 10 MG OR TABS
ORAL_TABLET | ORAL | Status: DC
Start: 2012-06-26 — End: 2013-01-02

## 2012-06-26 NOTE — Telephone Encounter (Signed)
faxed

## 2012-06-26 NOTE — Telephone Encounter (Signed)
Your patient has requested:  Drug: Zolpidem 10mg   Directions: 1 QHS prn  Qty:30  Last filled: unknown (#30 +5 refills authorized on 03/05/12)  [facility]: [facility]     If the prescription is approved please make sure the signed hard copy is faxed to the requesting pharmacy or called in.   If it is not approved please let the patient & requesting pharmacy know.

## 2012-06-26 NOTE — Telephone Encounter (Signed)
CONFIRMED PHONE NUMBER: [phone]  CALLERS FIRST AND LAST NAME: Connie [name]    FACILITY NAME: na TITLE: na  CALLERS RELATIONSHIP:Self  RETURN CALL: General message OK    SUBJECT: General Message   REASON FOR REQUEST: Zolpidem    MESSAGE: Received call from patient stating that she was on vacation [place]. Patient call the [facility] and they asked the patient to contact Dr. Festus Aloe for refill.

## 2012-09-13 ENCOUNTER — Other Ambulatory Visit: Payer: Self-pay

## 2012-10-30 ENCOUNTER — Telehealth (INDEPENDENT_AMBULATORY_CARE_PROVIDER_SITE_OTHER): Payer: Self-pay | Admitting: Internal Medicine

## 2012-10-30 MED ORDER — DESONIDE 0.05 % EX OINT
TOPICAL_OINTMENT | CUTANEOUS | Status: DC
Start: 2012-10-30 — End: 2013-08-29

## 2012-10-30 NOTE — Telephone Encounter (Signed)
CONFIRMED PHONE NUMBER: [phone]  CALLERS FIRST AND LAST NAME: Connie [name]    FACILITY NAME: na/n TITLE: a  CALLERS RELATIONSHIP:Self  RETURN CALL: OK to leave detailed message with anyone that answers    SUBJECT: Prescription Management   REASON FOR REQUEST: Patient would like to know if Dr.Ferrucci can renew and authorize an old prescription she has for reverse psoriasis.    MEDICATION: Desonide ointment  CONCERN/QUESTION: needs renewal authorization  PRESCRIBING PROVIDER: Dr.Ferrucci  DOSE TAKING NOW: n/a  PHARMACY NAME, LOCATION, & PHONE #: [facility] in [place]/ tel no. [phone]    Please advise and assist, thank you.

## 2012-10-30 NOTE — Telephone Encounter (Signed)
Patient has used Desonide ointment in the past for psoriasis flares.   Originally Rx'd by a dermatologist that Dr. Festus Aloe had sent her to.   Pt is currently having a flare near her umbilicus and above her pubic area.     If ok to send in script, pharmacy is loaded. Otherwise we will notify her if this requires an OV.

## 2012-10-30 NOTE — Telephone Encounter (Signed)
Faxed

## 2012-11-13 ENCOUNTER — Ambulatory Visit: Payer: Self-pay | Admitting: Family Medicine

## 2013-01-01 ENCOUNTER — Other Ambulatory Visit: Payer: Self-pay | Admitting: Pharmacist

## 2013-01-01 NOTE — Telephone Encounter (Signed)
Your patient has requested:  Drug: zolpidem 10 mg tab  Directions: 1 tab po hs prn insomnia  Qty: 30 tabs  Last filled: 12/04/12  [facility]: [facility]     If the prescription is approved please make sure the signed hard copy is faxed to the requesting pharmacy or called in.   If it is not approved please let the patient & requesting pharmacy know.

## 2013-01-02 MED ORDER — ZOLPIDEM TARTRATE 5 MG OR TABS
ORAL_TABLET | ORAL | Status: DC
Start: 2013-01-02 — End: 2013-04-28

## 2013-01-02 NOTE — Telephone Encounter (Signed)
refill approved. Please ask an onsite provider to sign this controlled medication.    Please let her know that the new dosing for women is 5 mg.

## 2013-01-02 NOTE — Telephone Encounter (Addendum)
Faxed  Left message for pt to return call

## 2013-01-03 NOTE — Telephone Encounter (Signed)
Please have her try the 5 mg pill. She can take an additional 1/2 pill if she cannot attain or maintain sleep.

## 2013-01-03 NOTE — Telephone Encounter (Addendum)
Patient notified of rx and strength of meds. Patient states that she has been taking 10mg  in the past and it has become less effective for her over time. She is concerned that the 5 mg dosing will not help at all. She wonders if she can take 1.5 tablets if necessary. She does state that she is trying to wean off the medication and knows that she should discuss this with Dr Festus Aloe first. She has an upcoming appointment on 3/5 and states she can discuss the best way to wean off Zolpidem at the appt. She would like to know what to do in the meantime.

## 2013-01-03 NOTE — Telephone Encounter (Signed)
Called pt and relayed message.

## 2013-01-06 NOTE — Telephone Encounter (Signed)
Received refill request on 01/03/13 for zolpidem 10 mg tablets - 30 tabs last filled 12/04/12    The zolpidem 5 mg tablets have been authorized as of 01/02/13.    Called [facility] and informed them of the change.

## 2013-01-21 ENCOUNTER — Ambulatory Visit (INDEPENDENT_AMBULATORY_CARE_PROVIDER_SITE_OTHER): Payer: BLUE CROSS/BLUE SHIELD | Admitting: Physician Assistant

## 2013-01-29 ENCOUNTER — Ambulatory Visit (INDEPENDENT_AMBULATORY_CARE_PROVIDER_SITE_OTHER): Payer: BLUE CROSS/BLUE SHIELD | Admitting: Internal Medicine

## 2013-03-04 ENCOUNTER — Ambulatory Visit (INDEPENDENT_AMBULATORY_CARE_PROVIDER_SITE_OTHER): Payer: BLUE CROSS/BLUE SHIELD | Admitting: Internal Medicine

## 2013-03-04 ENCOUNTER — Encounter (INDEPENDENT_AMBULATORY_CARE_PROVIDER_SITE_OTHER): Payer: Self-pay | Admitting: Internal Medicine

## 2013-03-04 VITALS — BP 120/81 | HR 80 | Temp 99.0°F | Resp 12 | Ht 61.0 in | Wt 183.0 lb

## 2013-03-04 NOTE — Progress Notes (Signed)
Connie [name] is a 52 year old female here for an annual health maintenance exam.     PREVIOUSLY ENTERED HISTORICAL INFORMATION:    Patient Active Problem List    Diagnosis Date Noted   . UNSP ASTHMA W/O STATUS ASTHMATICUS [493.90] 09/23/2004   . ALLERGIC RHINITIS NOS [477.9] 05/24/2004   . 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 H/O SPON ABORT x 2 [634.90]    . Insomnia [780.52] 03/04/2013       Past Medical History   Diagnosis Date   . No significant past medical history        Past Surgical History   Procedure Laterality Date   . No prior surgeries         Obstetric History    G4   P1   T1   P0   A0   TAB0   SAB3   E0   M0   L1    had a w/u for her SAbs; had a successful pregnancy thereafter while on progest       Family History   Problem Relation Age of Onset   . Heart disease Father    . Lipids Mother    . Other Father      Crohns       Current Outpatient Prescriptions   Medication Sig   . AEROCHAMBER MV MISC to use with Albuterol MDI   . Albuterol 90 MCG/ACT Inhalation Aero Soln Inhale 2 puffs every 4 to 6 hours as needed   . Desonide 0.05 % External Ointment use twice per day  as needed for psoriasis   . Fluticasone Propionate  HFA (FLOVENT HFA) 110 MCG/ACT IN AERO Inhale 1 puff by mouth twice daily regardless of symptoms, rinse mouth after use   . FLUTICASONE PROPIONATE, NASAL, (FLONASE) 50 MCG/ACT NA SUSP 1 spray to each nostril twice daily regardless of symptoms   . Zolpidem Tartrate 5 MG Oral Tab 1 TABLETS AT BEDTIME AS NEEDED   . ZYRTEC 10 MG OR TABS 1 PO QD     No current facility-administered medications for this visit.        History     Social History   . Marital Status: Married     Spouse Name: N/A     Number of Children: N/A   . Years of Education: N/A     Occupational History   . Not on file.     Social History Main Topics   . Smoking status: Former Smoker -- 0.50 packs/day for 10 years     Types: Cigarettes     Quit date: 08/27/1989   . Smokeless tobacco: Never Used   . Alcohol  Use: Yes      Comment: 1-2 every coulpe of weeks   . Drug Use: No   . Sexually Active: Yes -- Female partner(s)      Comment: mutually mongamous since 1987     Other Topics Concern   . Special Diet Yes     Weight Watchers off and on   . Exercise No     swimming     Social History Narrative    Mom for 1 child, husband is a Administrator.  Has lived in [place] since 1993.     Pt used to do glass work                PHYSICAL EXAM:    General: healthy, alert, no distress  Skin: Skin color, texture, turgor normal.  No rashes or concerning lesions  Head: Normocephalic. No masses, lesions, tenderness or abnormalities  Eyes: Lids/periorbital skin normal, Conjunctivae/corneas clear, PERRL, EOM's intact  Ears: External ears normal. Canals clear. TM's normal.  Nose:normal  Oropharynx: Lips, mucosa, and tongue normal. Teeth and gums normal., posterior pharynx without erythema or drainage  Neck: supple. No adenopathy. Thyroid symmetric, normal size, without nodules and no bruits   Lungs: clear to auscultation  Heart: normal rate, regular rhythm and no murmurs, clicks, or gallops  Breasts: No obvious deformity or mass to inspection, nipples everted bilaterally, no skin lesion or nipple discharge, no mass palpated, no axillary lymphadenopathy  Abd: soft, non-tender. BS normal. No masses or organomegaly  GU: deferred   Rectal: sphincter tone normal and no mass  Ext: Normal, without deformities, edema, or skin discoloration, radial and DP pulses 2+ bilaterally  Neuro: grossly normal       ASSESSMENT AND PLAN:    1. Routine screening:     LIPID PANEL        : MAMMOGRAM, SCREENING              2. Immunizations today:         Immunization History   Administered Date(s) Administered   . Hepatitis A 08/22/2002, 08/01/2006   . IPV (POLIO) 08/22/2002   . Influenza 09/25/2005   . MMR 08/22/2002   . Pandemic Influenza 10/30/2008   . Td 12/31/2001   . Tdap 08/01/2011   . Typhoid 08/01/2006   . Yellow Fever 08/01/2006     3. Preventive  counseling: healthy dietary guidelines, proper exercise, vitamin D supplementation       ==========================================================================  The following represents signficant issues addressed separately at this visit.        (780.52) Insomnia  (786.09) Snoring disorder  She continues to struggle with sleep. 5 mg of zolpidem helps but she awakens frequently during the night and does not feel well rested. She does snore.    PE:  BP 120/81  Pulse 80  Temp(Src) 99 F (37.2 C) (Temporal)  Resp 12  Ht 5\' 1"  (1.549 m)  Wt 183 lb (83.008 kg)  BMI 34.6 kg/m2  SpO2 98%    A/P    (780.52) Insomnia  Plan: REFERRAL TO [facility]            (786.09) Snoring disorder  Plan: REFERRAL TO SLEEP DISORDER [facility]

## 2013-03-04 NOTE — Patient Instructions (Addendum)
If allergy symptoms are bothering you , start with your zyrtec. Add fluticasone nasal inhaler if needed. Start your fluticasone lung inhaler now.

## 2013-03-05 ENCOUNTER — Ambulatory Visit (HOSPITAL_BASED_OUTPATIENT_CLINIC_OR_DEPARTMENT_OTHER): Admit: 2013-03-05 | Discharge: 2013-03-05 | Disposition: A | Discharge status: Home / Self Care | Payer: Self-pay

## 2013-03-05 LAB — LIPID PANEL
Cholesterol (LDL): 123 mg/dL (ref <130)
Cholesterol/HDL Ratio: 5.6
HDL Cholesterol: 43 mg/dL (ref >40)
Non-HDL Cholesterol: 199 mg/dL — ABNORMAL HIGH (ref 0–159)
Total Cholesterol: 242 mg/dL — ABNORMAL HIGH (ref <200)
Triglyceride: 380 mg/dL — ABNORMAL HIGH (ref <150)

## 2013-03-05 NOTE — Addendum Note (Signed)
Addended by: Romelle Starcher GRACE on: 03/05/2013 06:22 AM     Modules accepted: Orders

## 2013-03-11 ENCOUNTER — Other Ambulatory Visit (INDEPENDENT_AMBULATORY_CARE_PROVIDER_SITE_OTHER): Payer: Self-pay | Admitting: Internal Medicine

## 2013-03-11 NOTE — Telephone Encounter (Signed)
Patient states that she is needing this medication refilled today if possible because she is out. Please contact patient regarding medication. General message ok

## 2013-03-12 MED ORDER — ALBUTEROL SULFATE HFA 108 (90 BASE) MCG/ACT IN AERS
INHALATION_SPRAY | RESPIRATORY_TRACT | Status: DC
Start: 2013-03-12 — End: 2016-04-18

## 2013-03-14 ENCOUNTER — Other Ambulatory Visit: Payer: Self-pay

## 2013-03-20 ENCOUNTER — Encounter (HOSPITAL_BASED_OUTPATIENT_CLINIC_OR_DEPARTMENT_OTHER): Payer: BLUE CROSS/BLUE SHIELD | Admitting: Pulmonary Disease

## 2013-04-03 ENCOUNTER — Ambulatory Visit (HOSPITAL_BASED_OUTPATIENT_CLINIC_OR_DEPARTMENT_OTHER): Payer: BLUE CROSS/BLUE SHIELD | Attending: Pulmonary Disease | Admitting: Pulmonary Disease

## 2013-04-03 DIAGNOSIS — J309 Allergic rhinitis, unspecified: Secondary | ICD-10-CM | POA: Insufficient documentation

## 2013-04-03 DIAGNOSIS — R0609 Other forms of dyspnea: Secondary | ICD-10-CM | POA: Insufficient documentation

## 2013-04-03 DIAGNOSIS — G4700 Insomnia, unspecified: Secondary | ICD-10-CM | POA: Insufficient documentation

## 2013-04-14 ENCOUNTER — Ambulatory Visit (HOSPITAL_BASED_OUTPATIENT_CLINIC_OR_DEPARTMENT_OTHER): Payer: BLUE CROSS/BLUE SHIELD | Attending: Pulmonary Disease

## 2013-04-14 DIAGNOSIS — G4733 Obstructive sleep apnea (adult) (pediatric): Secondary | ICD-10-CM | POA: Insufficient documentation

## 2013-04-15 ENCOUNTER — Ambulatory Visit (HOSPITAL_BASED_OUTPATIENT_CLINIC_OR_DEPARTMENT_OTHER): Payer: BLUE CROSS/BLUE SHIELD | Attending: Pulmonary Disease

## 2013-04-15 DIAGNOSIS — G4733 Obstructive sleep apnea (adult) (pediatric): Secondary | ICD-10-CM | POA: Insufficient documentation

## 2013-04-28 ENCOUNTER — Other Ambulatory Visit: Payer: Self-pay | Admitting: Internal Medicine

## 2013-04-28 MED ORDER — ZOLPIDEM TARTRATE 5 MG OR TABS
ORAL_TABLET | ORAL | Status: DC
Start: 2013-04-28 — End: 2013-06-18

## 2013-04-28 NOTE — Telephone Encounter (Signed)
Your patient has requested:  Drug: zolpidem 5 mg tab  Directions: 1 tab po hs prn insomnia  Qty: 30 tabs  Last filled: 03/31/13  Ph[facility]acy: [id number]    If the prescription is approved please make sure the signed hard copy is faxed to the requesting pharmacy or called in.   If it is not approved please let the patient & requesting pharmacy know.

## 2013-04-28 NOTE — Telephone Encounter (Signed)
refill approved. Please ask an onsite provider to sign this controlled medication.

## 2013-04-29 NOTE — Telephone Encounter (Signed)
Faxed

## 2013-05-05 ENCOUNTER — Encounter (HOSPITAL_BASED_OUTPATIENT_CLINIC_OR_DEPARTMENT_OTHER): Payer: Self-pay | Admitting: Pulmonary Disease

## 2013-05-05 ENCOUNTER — Ambulatory Visit (HOSPITAL_BASED_OUTPATIENT_CLINIC_OR_DEPARTMENT_OTHER): Payer: BLUE CROSS/BLUE SHIELD | Attending: Pulmonary Disease | Admitting: Pulmonary Disease

## 2013-05-05 VITALS — BP 133/87 | HR 91 | Temp 98.6°F | Resp 16 | Ht 61.0 in | Wt 183.0 lb

## 2013-05-05 DIAGNOSIS — G4700 Insomnia, unspecified: Secondary | ICD-10-CM | POA: Insufficient documentation

## 2013-05-05 DIAGNOSIS — G4730 Sleep apnea, unspecified: Secondary | ICD-10-CM | POA: Insufficient documentation

## 2013-05-05 NOTE — Progress Notes (Signed)
SLEEP CENTER FOLLOW-UP VISIT    REFERRING PROVIDER: Oval Linsey Ferrucci    CC   Connie [name] is a 52 year old female being seen in follow-up for sleep apnea and insomnia.      INTERVAL HISTORY    Since her initial visit, she underwent two nights of home sleep testing.  Both nights showed evidence of sleep apnea -- mild with an AHI of 5-6 with minimal desaturation.  She did have a propensity for central apneas, possibly post-arousal centrals or high loop gain.    She continues to struggle with insomnia and frequent awakenings in her sleep.  She relies on the zolpidem 5mg  to get to sleep.  She is not certain what wakes her up.  She has to urinate sometimes.  She would like to stop the zolpidem and not be reliant on it.  Her husband is bothered by her snoring and they sleep in separate rooms.      REVIEW OF SYSTEMS  Psychiatric: no depression or anxiety  Constitutional:  no pain, fevers, or sweats  Health Maintenance: no weight change, exercising regularly  Neurologic:  no headaches    ALLERGIES    Review of patient's allergies indicates no known allergies.    Current Outpatient Prescriptions   Medication Sig   . AEROCHAMBER MV MISC to use with Albuterol MDI   . Albuterol Sulfate HFA (VENTOLIN HFA) 108 (90 BASE) MCG/ACT Inhalation Aero Soln Inhale 2 puffs every 4 to 6 hours as needed   . Desonide 0.05 % External Ointment use twice per day  as needed for psoriasis   . Fluticasone Propionate  HFA (FLOVENT HFA) 110 MCG/ACT IN AERO Inhale 1 puff by mouth twice daily regardless of symptoms, rinse mouth after use   . FLUTICASONE PROPIONATE, NASAL, (FLONASE) 50 MCG/ACT NA SUSP 1 spray to each nostril twice daily regardless of symptoms   . Zolpidem Tartrate 5 MG Oral Tab 1 TABLETS AT BEDTIME AS NEEDED   . ZYRTEC 10 MG OR TABS 1 PO QD     No current facility-administered medications for this visit.       PHYSICAL EXAM  BP 133/87  Pulse 91  Temp(Src) 98.6 F (37 C) (Temporal)  Resp 16  Ht 5\' 1"  (1.549 m)  Wt 183  lb (83.008 kg)  BMI 34.6 kg/m2  SpO2 97%  GEN: pleasant, NAD  HEENT:  Sclerae anicteric.  No ptosis.  PERRL EOMI B.   CV: RRR no m/r/g  RESP:  CTAB no w/r/r  EXT: no lower extremity edema  NEURO: A&Ox3.   Normal gait.  PSYCH: appropriate affect    ASSESSMENT  52 yo woman with obstructive sleep apnea (mild with AHI of 5-6) and a tendency for central apneas.  She has frequent awakenings and disturbed sleep as well as long-standing insomnia requiring zolpidem.    We discussed perpetuating factors that may exacerbate sleep-onset insomnia, specifically bedtime habits. We discussed the importance of good sleep hygiene, stimulus control therapy, a consistent wake time and relaxing bedtime routine.  She reports she has already implemented most of these changes.  We also discussed that sleep apnea may be contributing to her delayed sleep onset and sleep disruptions with awakening and arousals; certainly the centrals may be causing more arousals.  She may have a low arousal threshold phenotype for sleep apnea.  We discussed the benefits of CPAP in treating her mild OSA, improving sleep stability and reducing high loop gain over time and the central apneas. She  may notice that she has more energy and alertness after using CPAP.    The patient was seen face-to-face for 25 minutes more than 50% of the time was spent counseling.    PLAN  Start auto-CPAP at 5-15cm.  She will need to pay out of pocket b/c she has a high deductible. She will rent the device for one month and then reassess.  Return to clinic in one month for download and evaluation of response to CPAP.    I have also given her several handouts on insomnia, one from the [employer] and another from the [employer] to review.       Thank you for the opportunity to participate in this patient's care.

## 2013-05-05 NOTE — Patient Instructions (Signed)
We will start you on CPAP for your mild sleep apnea which is likely contributing to your insomnia.    After you trial it for 2-3 months, we can reassess and see if has been helpful in improving your sleep disturbance or if adjustments need to be made.    It is fine to continue taking the zolpidem while you start the CPAP so you can get used to it.      Treating Insomnia  Good sleeping habits are a key part of treatment. If needed, some medications may help you sleep better at first. Making healthy lifestyle changes and learning to relax can improve your sleep. Treating insomnia takes commitment, but trust that your efforts will pay off. Talk to your health care provider before taking any medication.    Healthy Lifestyle  Your lifestyle affects your health and your sleep. Here are some healthy habits:   Keep a regular sleep schedule. Go to bed and get up at the same time each day.   Exercise regularly. It may help you reduce stress. Avoid strenuous exercise for two to four hours before bedtime.   Avoid or limit naps.   Use your bed only for sleep and sex.   Don't spend too much time in bed trying to fall asleep. If you can't fall asleep, get up and do something until you become tired and drowsy.   Avoid or limit caffeine and nicotine. They can keep you awake at night. Also avoid alcohol. It may help you fall asleep at first, but your sleep will not be restful.  Before Bedtime  To sleep better every night, try these tips:   Have a bedtime routine to let your body and mind know when it's time to sleep.   Think of going to bed as relaxing and enjoyable. Sleep will come sooner.   If your worries don't let you sleep, write them down in a diary. Then close it, and go to bed.   Make sure the room is not too hot or too cold. If it's not dark enough, an eye mask can help. If it's noisy, try using earplugs.  Learn to Relax  Stress, anxiety, and body tension may keep you awake at night. To unwind before bedtime, try a  warm bath, meditation, or yoga. Also, try the following:   Deep breathing. Sit or lie back in a chair. Take a slow, deep breath. Hold it for 5 counts. Then breathe out slowly through your mouth. Keep doing this until you feel relaxed.   Progressive muscle relaxation. Tense and then relax the muscles in your body as you breathe deeply. Start with your feet and work up your body to your neck and face.   [address], [address] [place], [place] [address]. All rights reserved. This information is not intended as a substitute for professional medical care. Always follow your healthcare professional's instructions.        Continuous Positive Air Pressure (CPAP)  Continuous positive air pressure (CPAP)uses gentle air pressure to hold the airway open. CPAP is often the most effective treatment for sleep apnea and severe snoring. It works very well for many people. But keep in mind that it can take several adjustments before the setup is right for you.    How CPAP Works  A small portable pump beside the bed sends air through a hose, which is held over your nose by a mask. Air is gently pushed through your airway. The air pressure nudges  sagging tissues aside. This widens the airway so you can breathe better. CPAP may be combined with other kinds of therapy for sleep apnea.     A mask over the nose gently directs air into the throat to keep the airway open.   Types of Air Pressure Treatments  There are different types of CPAP. Your doctor or CPAP technician will help you decide which type is best for you:   Basic CPAPkeeps the pressure constant all night long.   A bilevel devicegives out more pressure when you breathe in and less when you breathe out.   An autoCPAP deviceautomatically adjusts pressure throughout the night and in response to changes such as body position, sleep stage, and snoring.   [address], [place], [place] [address]. All rights reserved. This  information is not intended as a substitute for professional medical care. Always follow your healthcare professional's instructions.

## 2013-05-08 ENCOUNTER — Telehealth (HOSPITAL_BASED_OUTPATIENT_CLINIC_OR_DEPARTMENT_OTHER): Payer: Self-pay | Admitting: Pulmonary Disease

## 2013-05-08 NOTE — Telephone Encounter (Signed)
CC:  Call to patient per Dr. Troy Sine request.    RESOLUTION:  No answer.  Left message to call the clinic per Dr. Troy Sine if she prefers to try the auto-CPAP or wants to do in-lab study (will need to argue with her insurance company to get approved and may be high cost) to see if CPAP helps OSA and central apneas. I suspect centrals will improve on CPAP after a few weeks.

## 2013-05-12 NOTE — Telephone Encounter (Signed)
Please try her again regarding her preference to trial APAP or if would like to do in-lab to see if CPAP is helpful.  I recommend just a month trial of the APAP instead of in-lab titration as I suspect the APAP will be effective despite her tendency for central apneas.  But if she would rather not take that chance, we are happy to schedule her for an in-lab.

## 2013-06-12 NOTE — Telephone Encounter (Signed)
Call to pt and voicemail left stating Dr Troy Sine message , see below. Pts answering machine identifies this as her phone by name .

## 2013-06-12 NOTE — Telephone Encounter (Signed)
Call to pt and voicemail left to call clinic . Reason for call was to f/u on earlier detailed message

## 2013-06-18 ENCOUNTER — Other Ambulatory Visit (INDEPENDENT_AMBULATORY_CARE_PROVIDER_SITE_OTHER): Payer: Self-pay | Admitting: Internal Medicine

## 2013-06-18 MED ORDER — ZOLPIDEM TARTRATE 5 MG OR TABS
ORAL_TABLET | ORAL | Status: DC
Start: 2013-06-18 — End: 2013-08-28

## 2013-06-18 NOTE — Telephone Encounter (Signed)
Your patient has requested:  Drug: zolpidem 5 mg tab  Directions: 1 tab po hs prn  Qty: 30 tabs  Last filled: 05/22/13  [facility]     If the prescription is approved please make sure the signed hard copy is faxed to the requesting pharmacy or called in.   If it is not approved please let the patient & requesting pharmacy know.

## 2013-06-18 NOTE — Telephone Encounter (Signed)
Faxed

## 2013-06-18 NOTE — Telephone Encounter (Signed)
Left in the fax box.

## 2013-06-18 NOTE — Telephone Encounter (Signed)
CONFIRMED PHONE NUMBER: [phone]    CALLERS FIRST AND LAST NAME: Dyane Dustman    FACILITY NAME: na TITLE: na  CALLERS RELATIONSHIP:Self  RETURN CALL: General message OK     SUBJECT: General Message   REASON FOR REQUEST: Refill for Zolpidem    MESSAGE: Received call from patient asking for refill for Zolpidem. Patient is asking the request to be completed by tomorrow.

## 2013-08-28 ENCOUNTER — Other Ambulatory Visit: Payer: Self-pay | Admitting: Internal Medicine

## 2013-08-28 NOTE — Telephone Encounter (Signed)
Your patient has requested:  Drug: Zolpidem 5mg   Directions: 1tab QHS prn  Qty: #30  Last filled: 07/29/13  [facility]: [id number]    If the prescription is approved please make sure the signed hard copy is faxed to the requesting pharmacy or called in.   If it is not approved please let the patient & requesting pharmacy know.

## 2013-08-29 ENCOUNTER — Other Ambulatory Visit (INDEPENDENT_AMBULATORY_CARE_PROVIDER_SITE_OTHER): Payer: Self-pay | Admitting: Internal Medicine

## 2013-08-29 MED ORDER — ZOLPIDEM TARTRATE 5 MG OR TABS
5.0000 mg | ORAL_TABLET | Freq: Every evening | ORAL | Status: DC | PRN
Start: 2013-08-28 — End: 2013-11-07

## 2013-08-29 MED ORDER — DESONIDE 0.05 % EX OINT
1.0000 | TOPICAL_OINTMENT | Freq: Two times a day (BID) | CUTANEOUS | Status: DC | PRN
Start: 2013-08-29 — End: 2014-02-23

## 2013-08-29 MED ORDER — DESONIDE 0.05 % EX OINT
1.0000 | TOPICAL_OINTMENT | Freq: Two times a day (BID) | CUTANEOUS | Status: DC
Start: 2013-08-29 — End: 2013-08-29

## 2013-08-29 NOTE — Telephone Encounter (Signed)
Left in the fax box.

## 2013-08-29 NOTE — Telephone Encounter (Addendum)
Patient last seen on 03/04/13 for a physical exam    Last filled 15 gm on 06/18/13    Authorized 15 grams + 2 refills.    Location to apply: abdomen

## 2013-08-29 NOTE — Telephone Encounter (Signed)
Pt requesting refill on Desonide.  Rx and pharmacy are pended for signing, if you approve.  Pt states she is going out of town on Sunday for a week.

## 2013-08-29 NOTE — Telephone Encounter (Signed)
Patient is checking on status of prescription please expedite

## 2013-08-29 NOTE — Telephone Encounter (Signed)
Spoke with pt and confirmed that she wanted Rx sent to [facility] on [address] in [place].  Told pt I would call the Rx in for her as it was approved by PCP.  Called Rx in to [facility].

## 2013-09-04 ENCOUNTER — Encounter (INDEPENDENT_AMBULATORY_CARE_PROVIDER_SITE_OTHER): Payer: Self-pay

## 2013-09-05 IMAGING — MG MM CAD SCREENING MAMMO
1 series · 4 of 4 positions shown · non-contrast
Comparison: none

REASON FOR EXAM: SCR MAMMO NO ORDER
COMMENTS:

[R CC · right · 4 of 4 slices shown]
[im 1/4]
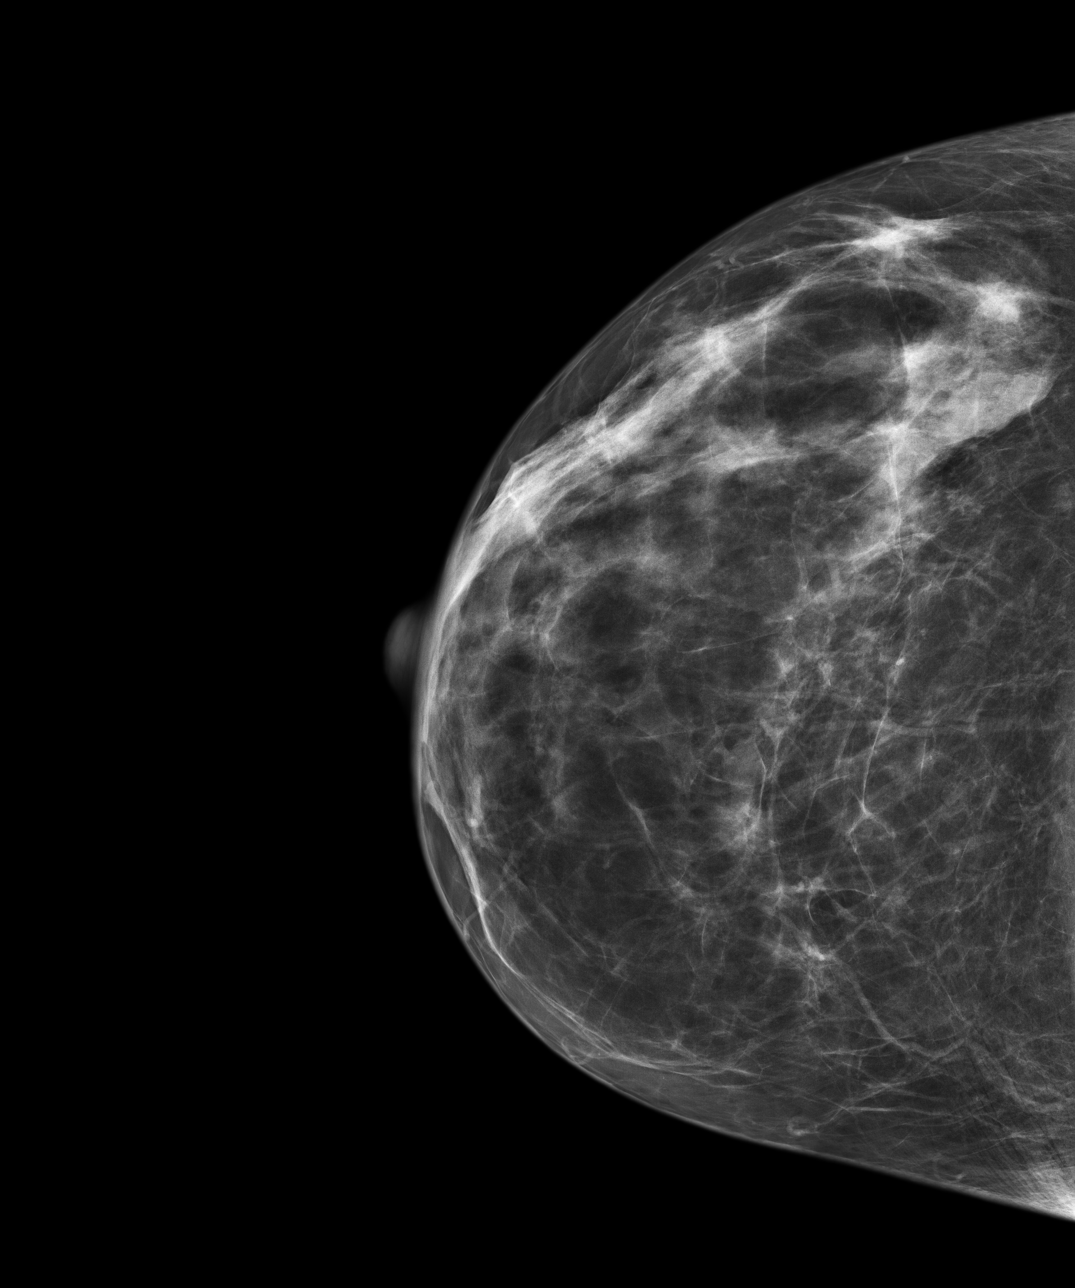
[im 2/4]
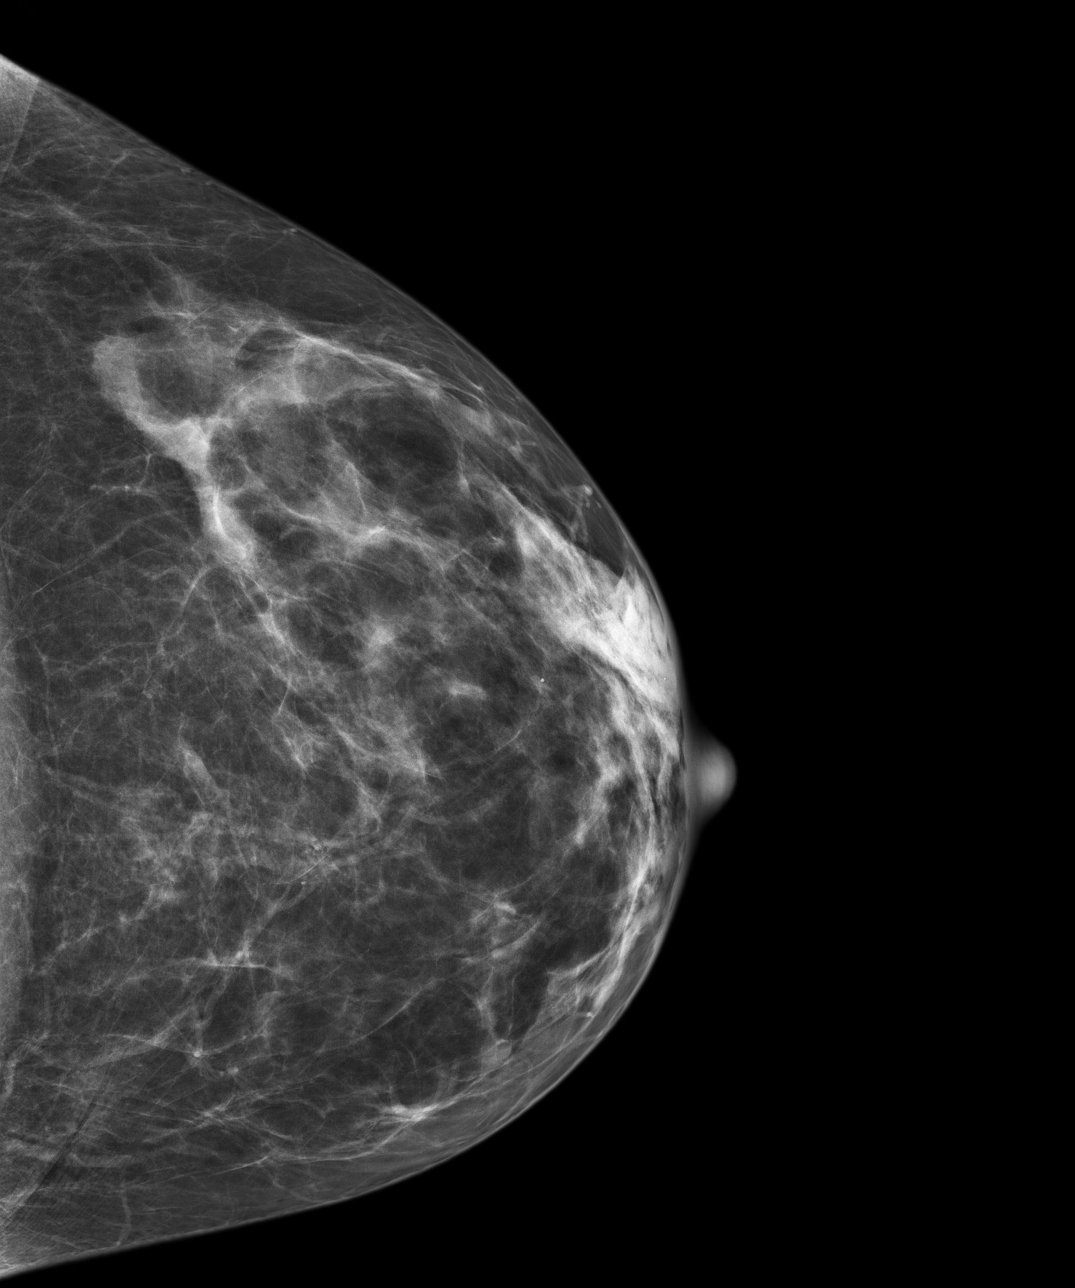
[im 3/4]
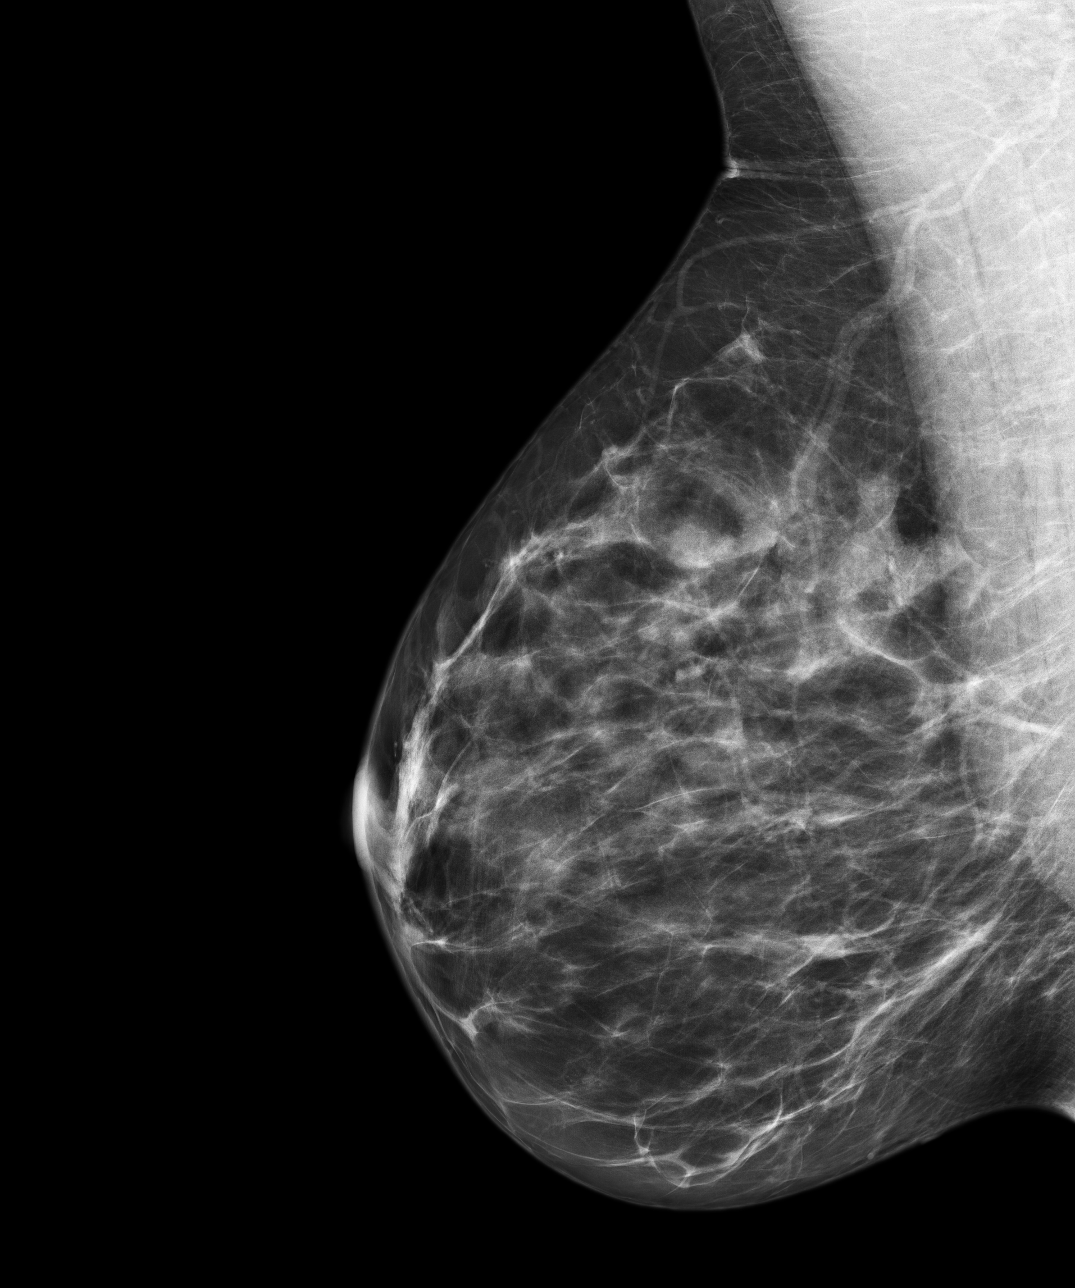
[im 4/4]
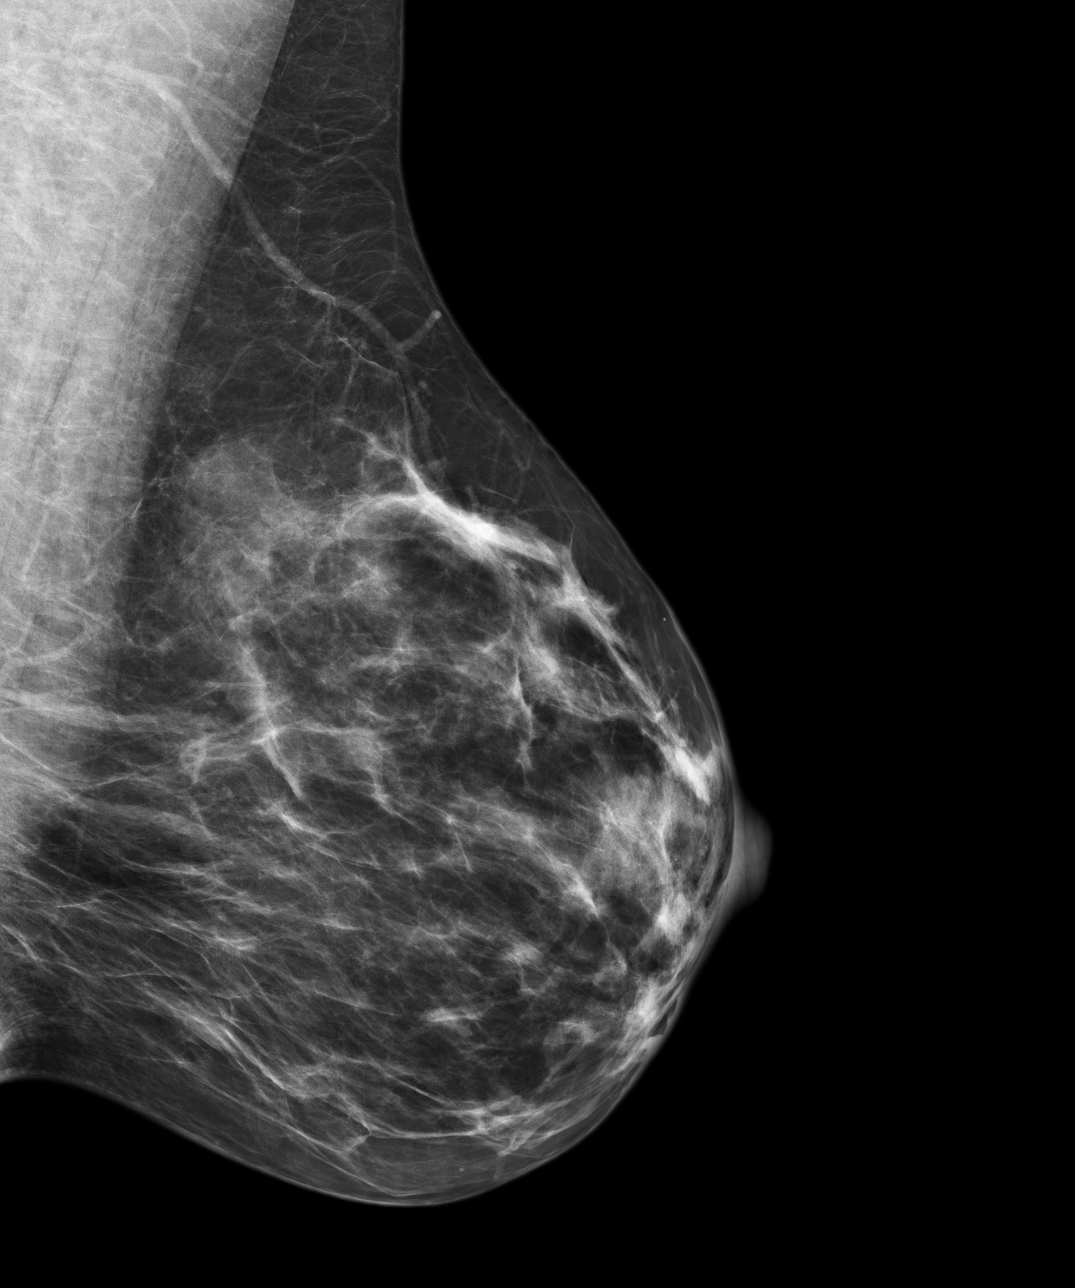

[4 of 4 positions shown; findings below may reference images not displayed]

PROCEDURE:     MAM - MAM DGTL SCRN MAM NO ORDER W/CAD  - November 13, 2012  [DATE]

RESULT:     Comparison made to previous digital studies May 25, 2010,June 18, 2008, and Tuesday May, 2007.

The breasts exhibit a heterogeneously dense parenchymal pattern. There is no
dominant mass. There are no malignant appearing groupings of
microcalcification. There is no area of new architectural distortion.
IMPRESSION: There are no findings suspicious for malignancy.

BI-RADS 2: benign findings.

Recommendation: Please continue to encourage yearly mammographic followup.

A NEGATIVE MAMMOGRAM REPORT DOES NOT PRECLUDE BIOPSY OR OTHER EVALUATION OF
A CLINICALLY PALPABLE OR OTHERWISE SUSPICIOUS MASS OR LESION. BREAST CANCER
MAY NOT BE DETECTED BY MAMMOGRAPHY IN UP TO 10% OF CASES.

[REDACTED]

## 2013-10-07 ENCOUNTER — Ambulatory Visit (INDEPENDENT_AMBULATORY_CARE_PROVIDER_SITE_OTHER): Payer: BLUE CROSS/BLUE SHIELD

## 2013-10-07 NOTE — Progress Notes (Signed)
[facility]  Physical Therapy Initial Evaluation    Connie [name]  Today's date: 10/07/2013  Onset: insidious   Referring Provider: Self Referred      Time In: 11:30AM Time Out: 1:00PM Total Time: 90 Minutes  Treatment Diagnosis: chondromalacia     SUBJECTIVE:  Connie [name] is a 52 year old female who currently reports that she has been having extended difficulty with her knee. She states that she is very active in terms of walking and hiking. She has had significant difficulty in terms of going down hill.   OBJECTIVE:   Both knees have good flexion, there is a slight tendency towards recurvatum bilaterally. Medialis on both knees is deconditioned and limited ability to stabilize her patella. She denies popping, catching or locking.   - Range of Motion: within normal limits  - Resistance Testing: poor medialis   - Palpation: moderate tenderness on the odd medial facet on both knees.   - Neurology: unremarkable   - Special Test: Cruciate's, collateral ligaments, and cartilage are all negative.     TREATMENT:  Today's initial evaluation consisted of Manual therapy  30 minutes, Therapeutic exercise 30 minutes and Electrotherapy unattended 20 minutes. Please refer to the exercise flow sheet for specific exercises, repetitions, and dosage.   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 It is felt that skilled physical therapy is appropriate and their prognosis at this time is rated as good.  PLAN:   Physical therapy will consist of 2 visits per week, for 3 weeks. We are going to improve medialis function and general strength and mobility about the knee.     Thank you Self Referred for the referral of your patient, Connie [name]. We will notify you with any addendums to patient's plan of care and update you on their progress. If you have any questions or concerns regarding their plan of care or this report,  please contact me at the office.       [facility]

## 2013-10-10 ENCOUNTER — Ambulatory Visit (INDEPENDENT_AMBULATORY_CARE_PROVIDER_SITE_OTHER): Payer: BLUE CROSS/BLUE SHIELD

## 2013-10-10 NOTE — Progress Notes (Signed)
[facility]  Daily Treatment Note    Zyann Mabry  Today's date: 10/10/2013  Referring Provider: Self Referred    Time In: 11:30am Time Out: 12:30pm Total Time: 60 minutes  Treatment Diagnosis:   Encounter Diagnosis   Name Primary?   . Chondromalacia Yes       SUBJECTIVE:   Delbert states that her knee is feeling somewhat better since she began the exercise program. She has been able to keep it out of recurvatum.     OBJECTIVE:   No problem with the exercises. She seems to tolerate them well.     TREATMENT:   Today's treatment consisted of Manual therapy 15 minutes, Therapeutic exercise 30 minutes and Electrotherapy unattended 15  minutes.  Please refer to the exercise flow sheet for specific exercises, repetitions, and dosage.     ASSESSMENT:   The patient tolerated today's appointment good/normal.     PLAN:   The current physical therapy regimen is effective;  continue present plan of care. Modify exercises and plan as needed.       [facility]

## 2013-10-16 ENCOUNTER — Encounter (INDEPENDENT_AMBULATORY_CARE_PROVIDER_SITE_OTHER): Payer: BLUE CROSS/BLUE SHIELD

## 2013-10-20 ENCOUNTER — Ambulatory Visit (INDEPENDENT_AMBULATORY_CARE_PROVIDER_SITE_OTHER): Payer: BLUE CROSS/BLUE SHIELD

## 2013-10-20 NOTE — Progress Notes (Signed)
[facility]  Daily Treatment Note    Connie [name]  Today's date: 10/20/2013  Referring Provider: Self Referred    Time In: 9:51AM Time Out: 11:20AM Total Time: 90 minutes  Treatment Diagnosis:   Encounter Diagnosis   Name Primary?   . Chondromalacia Yes       SUBJECTIVE:   Connie [name] states that she is not having any difficulty with her knees since she has been performing the exercises. She does ask about treatments that would maintain stability around her pelvis and hips.    OBJECTIVE:   It is clear that she has been preforming her exercises because hip rotation concentrically and eccentrically has improved. She has some tightness to palpation over the distal ITB on both legs.     TREATMENT:   Today's treatment consisted of Manual therapy  30 minutes, Therapeutic exercise 15 minutes and Electrotherapy unattended 20 minutes.  Please refer to the exercise flow sheet for specific exercises, repetitions, and dosage.     ASSESSMENT:   The patient tolerated today's appointment good/normal.     PLAN:   The current physical therapy regimen is effective;  continue present plan of care. Modify exercises and plan as needed. We have balanced her SI joint today and preformed a soft tissue treatment on the bilateral ITB's, hopefully this will help her have more staying power.       [facility]

## 2013-11-03 ENCOUNTER — Encounter (INDEPENDENT_AMBULATORY_CARE_PROVIDER_SITE_OTHER): Payer: BLUE CROSS/BLUE SHIELD

## 2013-11-07 ENCOUNTER — Ambulatory Visit (INDEPENDENT_AMBULATORY_CARE_PROVIDER_SITE_OTHER): Payer: BLUE CROSS/BLUE SHIELD | Admitting: Internal Medicine

## 2013-11-07 ENCOUNTER — Encounter (INDEPENDENT_AMBULATORY_CARE_PROVIDER_SITE_OTHER): Payer: Self-pay | Admitting: Internal Medicine

## 2013-11-07 VITALS — BP 128/70 | HR 74 | Temp 98.0°F | Resp 12 | Ht 61.0 in | Wt 168.2 lb

## 2013-11-07 MED ORDER — ZOLPIDEM TARTRATE 5 MG OR TABS
5.0000 mg | ORAL_TABLET | Freq: Every evening | ORAL | Status: DC | PRN
Start: 2013-11-07 — End: 2013-11-25

## 2013-11-07 NOTE — Progress Notes (Signed)
ID/CC  Connie [name] is a 52 year old female who present with right ear pain and headaches.     Subjective  She developed pressure/pain of the inside of the right ear. This was followed by headache on the right side, generally in the parietal region. The headaches waxed/waned over the following days. She cleaned out her ear with hydrogen peroxide, and did get some wax out. This did not help her symptoms. The ear pain is now starting to improve, and the headaches have essentially resolved. She presents today as she is going to the [employer] game this weekend, and wants to make sure her ear is OK.     In addition to above, she would like refills of zolpidem. She takes this nightly for problems falling asleep. Satisfied with current therapy. Denies appreciable side effects.     Problem List  Patient Active Problem List   Diagnosis   . PROCREATIVE MGMT-COUNSEL   . H/O SPON ABORT x 2   . ALLERGIC RHINITIS NOS   . UNSP ASTHMA W/O STATUS ASTHMATICUS   . Insomnia   . Sleep apnea   . Chondromalacia       Allergies  Review of patient's allergies indicates:  No Known Allergies    Medications  Current Outpatient Prescriptions   Medication Sig   . AEROCHAMBER MV MISC to use with Albuterol MDI   . Albuterol Sulfate HFA (VENTOLIN HFA) 108 (90 BASE) MCG/ACT Inhalation Aero Soln Inhale 2 puffs every 4 to 6 hours as needed   . Desonide 0.05 % External Ointment Apply 1 application topically 2 times a day as needed (for psoriasis). Location to apply: abdomen   . Fluticasone Propionate  HFA (FLOVENT HFA) 110 MCG/ACT IN AERO Inhale 1 puff by mouth twice daily regardless of symptoms, rinse mouth after use   . FLUTICASONE PROPIONATE, NASAL, (FLONASE) 50 MCG/ACT NA SUSP 1 spray to each nostril twice daily regardless of symptoms   . Zolpidem Tartrate 5 MG Oral Tab Take 1 tablet (5 mg) by mouth at bedtime as needed.   Marland Kitchen ZYRTEC 10 MG OR TABS 1 PO QD     No current facility-administered medications for this visit.       ROS  Negative for  ear trauma, fevers, ear discharge, vision changes, dizziness, increased sinus congestion.     Exam  BP 128/70  Pulse 74  Temp(Src) 98 F (36.7 C) (Temporal)  Resp 12  Ht 5\' 1"  (1.549 m)  Wt 168 lb 3.2 oz (76.295 kg)  BMI 31.8 kg/m2  LMP 10/23/2013  GENERAL: very pleasant, NAD.   EYES: EOMI, no nystagmus, PERRL.  HENT: external auditory canals clear bilaterally, the right tympanic membrane has a mild effusion present, no significant purulence or erythema, the left tympanic membrane is clear. Sinuses non-tender bilaterally. Throat without erythema or exudates.   LYMPH: no cervical or supraclavicular lymphadenopathy.   NEURO: CN II-XII tested and intact. Moves all extremities purposefully and spontaneously. Gait normal.     Assessment/Plan  52 year old female who presents with right ear pain.    (388.70) Right ear pain  (primary encounter diagnosis)  Likely secondary to resolving middle ear effusion. Resolving acute otitis media is another consideration. At this point, I did not recommend antibiotics as symptoms seem to be improving without any specific intervention. No evidence of underlying neurologic cause of symptoms at this time.   - anticipate symptoms will resolve over the following days, OK to go to [employer]     (  780.52) Insomnia, unspecified  - refilled Zolpidem Tartrate 5 MG Oral Tab       Gabriel Earing, MD

## 2013-11-18 ENCOUNTER — Other Ambulatory Visit (HOSPITAL_BASED_OUTPATIENT_CLINIC_OR_DEPARTMENT_OTHER): Payer: Self-pay

## 2013-11-24 ENCOUNTER — Telehealth (INDEPENDENT_AMBULATORY_CARE_PROVIDER_SITE_OTHER): Payer: Self-pay | Admitting: Internal Medicine

## 2013-11-24 NOTE — Telephone Encounter (Addendum)
CONFIRMED PHONE NUMBER: [phone]  CALLERS FIRST AND LAST NAME: Connie [name]    FACILITY NAME: na TITLE: na  CALLERS RELATIONSHIP:Self  RETURN CALL: Detailed message on voicemail only     SUBJECT: Appointment Request   REASON FOR REQUEST: Ear infection    REQUEST APPOINTMENT WITH: Any Provider    REFERRING PROVIDER: na  REQUESTED DATE: asap  REQUESTED TIME: Any time    UNABLE TO APPOINT: Schedule appears full    Please Note: She has an appointment on 11/28/13 at 10:40am with Dr.Jayne-Jensen, but is requesting a sooner appointment. Also requesting to speak with an Charity fundraiser. Please Call her tomorrow after 9:00am

## 2013-11-25 ENCOUNTER — Ambulatory Visit (INDEPENDENT_AMBULATORY_CARE_PROVIDER_SITE_OTHER): Payer: BLUE CROSS/BLUE SHIELD | Admitting: Internal Medicine

## 2013-11-25 VITALS — BP 129/84 | HR 69 | Temp 98.6°F | Resp 12 | Wt 170.0 lb

## 2013-11-25 MED ORDER — ZOLPIDEM TARTRATE 5 MG OR TABS
5.0000 mg | ORAL_TABLET | Freq: Every evening | ORAL | Status: DC | PRN
Start: 2013-11-25 — End: 2014-02-23

## 2013-11-25 MED ORDER — AMOXICILLIN-POT CLAVULANATE 875-125 MG OR TABS
1.0000 | ORAL_TABLET | Freq: Two times a day (BID) | ORAL | Status: DC
Start: 2013-11-25 — End: 2015-03-19

## 2013-11-25 NOTE — Patient Instructions (Signed)
Your symptoms should be nearly gone in 10 days and resolved in about 3 weeks.

## 2013-11-25 NOTE — Telephone Encounter (Signed)
Triage Nurse Telephone Encounter  Chief Complaint: hearing problems/ earache  Reviewed Problem List, Medications and Allergies: YES    Description of symptoms: pt was seen 2 weeks ago on 11/07/13 by Dr. Mervyn Skeeters for right ear pain. Pain still remains and is also having some hearing problems  ROS: positive for persistent earache  Protocol used for assessment: hearing loss  Recommended disposition: See provider within 24 hours  Caller agrees:  YES    PLAN: Made an appt for pt to see PCP today at 3 pm.   Home care instructions provided:  NO   Instructed to call back or seek tx if sxs worsen or has new concerns: YES   Caller understands:  YES  Follow up call needed  NO  Reference used: [employer] edition, Telephone Triage Protocols for [employer] te

## 2013-11-25 NOTE — Progress Notes (Signed)
Patient Referred By: No ref. provider found  Patient's PCP: [name]     Subjective:  Patient is a 52 year old female, here to discuss Ear Pain    The following portions of the patient's history were reviewed and updated as appropriate: problem list, current medications, allergies, past medical history, past surgical history, past social history and past family history.    HPI    (388.70) Otalgia of right ear  (primary encounter diagnosis)  Attention is directed to the previous note in this chart.  She complains of continued pressure and muffling in the R ear. She denies congestion. She denies fever. She has not had barotrauma.     (780.52) Insomnia, unspecified  She requires a refill of medications.      Review of Systems      Objective:  Physical Exam   Nursing note and vitals reviewed.  Constitutional: She appears well-developed and well-nourished. No distress.   HENT:   Head: Normocephalic and atraumatic.   Right Ear: External ear normal.   Left Ear: External ear normal.   Nose: Nose normal.   Mouth/Throat: Oropharynx is clear and moist. No oropharyngeal exudate.   Purulent R tm   Eyes: Right eye exhibits no discharge. Left eye exhibits no discharge. No scleral icterus.   Lymphadenopathy:     She has no cervical adenopathy.        Assessment and Plan:   Diagnoses and associated orders for this visit:    OM (otitis media), acute, right  - Amoxicillin-Pot Clavulanate 875-125 MG Oral Tab; Take 1 tablet by mouth 2 times a day. follow up if not resolved.      Insomnia, unspecified  - Zolpidem Tartrate 5 MG Oral Tab; Take 1 tablet (5 mg) by mouth at bedtime as needed.

## 2013-11-28 ENCOUNTER — Encounter (INDEPENDENT_AMBULATORY_CARE_PROVIDER_SITE_OTHER): Payer: BLUE CROSS/BLUE SHIELD | Admitting: Internal Medicine

## 2013-12-03 ENCOUNTER — Telehealth (INDEPENDENT_AMBULATORY_CARE_PROVIDER_SITE_OTHER): Payer: Self-pay | Admitting: Internal Medicine

## 2013-12-03 NOTE — Telephone Encounter (Signed)
CONFIRMED PHONE NUMBER: [phone]  CALLERS FIRST AND LAST NAME: Dyane DustmanMary Leanora Helbig  FACILITY NAME: na TITLE: na  CALLERS RELATIONSHIP:Self  RETURN CALL: Detailed message on voicemail only     SUBJECT: General Message   REASON FOR REQUEST: Medication Question    MESSAGE: Patient has been taking amoxicillin for 8 days and has two days left but hasn't seen any change. Patient would like to know if this is normal.  Please call and advise.

## 2013-12-04 NOTE — Telephone Encounter (Signed)
Please let her know sometimes the ear congestion persists  For a week or two after infection has resolved, however, if ear congestion or pain is still present after the antibiotic is completed, she should return so we can look at her ear again.

## 2013-12-04 NOTE — Telephone Encounter (Signed)
Called pt and let her know that ear congestion can sometimes persist for a week or two after infection is gone. Let her know that if it continues after the abx is gone to come back in to have her ear looked at. Says she will be out of medication tomorrow so she will see how she feels but will probably make an appt to come in because she doesn't think she will feel better tomorrow but will finish the course and see how it goes.

## 2013-12-05 ENCOUNTER — Encounter (INDEPENDENT_AMBULATORY_CARE_PROVIDER_SITE_OTHER): Payer: Self-pay | Admitting: Internal Medicine

## 2013-12-05 ENCOUNTER — Ambulatory Visit (INDEPENDENT_AMBULATORY_CARE_PROVIDER_SITE_OTHER): Payer: BLUE CROSS/BLUE SHIELD | Admitting: Internal Medicine

## 2013-12-05 VITALS — BP 124/78 | HR 74 | Resp 12 | Ht 61.0 in | Wt 170.0 lb

## 2013-12-05 DIAGNOSIS — H6691 Otitis media, unspecified, right ear: Secondary | ICD-10-CM

## 2013-12-05 DIAGNOSIS — H6690 Otitis media, unspecified, unspecified ear: Secondary | ICD-10-CM

## 2013-12-05 MED ORDER — OFLOXACIN 0.3 % OT SOLN
10.0000 [drp] | Freq: Two times a day (BID) | OTIC | Status: DC
Start: 2013-12-05 — End: 2015-03-19

## 2013-12-05 NOTE — Progress Notes (Signed)
ID/CC  Connie DustmanMary Leanora [name] is a 53 year old female who presents for follow-up of ongoing right ear discomfort.     Subjective  She was first seen for right ear pain and headache on 11/07/13. At that time, she was diagnosed with a middle ear effusion. Symptoms had been improving, so no specific therapy was started. Unfortunately, she continued to have pressure and muffled hearing in the right ear after that visit. The headache also persisted. She returned to clinic on 12/30, where she was found to have a purulent right tympanic membrane. She was treated with Augmentin for 10 day course (to be completed today). She has had no improvement with this therapy. She does note having gone up in elevation, and having a brief 6 hour period of symptom resolution after returning to sea level. Her symptoms have otherwise been unchanged.       Problem List  Patient Active Problem List   Diagnosis   . PROCREATIVE MGMT-COUNSEL   . H/O SPON ABORT x 2   . ALLERGIC RHINITIS NOS   . UNSP ASTHMA W/O STATUS ASTHMATICUS   . Insomnia   . Sleep apnea   . Chondromalacia       Allergies  Review of patient's allergies indicates:  No Known Allergies    Medications  Current Outpatient Prescriptions   Medication Sig   . AEROCHAMBER MV MISC to use with Albuterol MDI   . Albuterol Sulfate HFA (VENTOLIN HFA) 108 (90 BASE) MCG/ACT Inhalation Aero Soln Inhale 2 puffs every 4 to 6 hours as needed   . Amoxicillin-Pot Clavulanate 875-125 MG Oral Tab Take 1 tablet by mouth 2 times a day.   Marland Kitchen. Desonide 0.05 % External Ointment Apply 1 application topically 2 times a day as needed (for psoriasis). Location to apply: abdomen   . Fluticasone Propionate  HFA (FLOVENT HFA) 110 MCG/ACT IN AERO Inhale 1 puff by mouth twice daily regardless of symptoms, rinse mouth after use   . FLUTICASONE PROPIONATE, NASAL, (FLONASE) 50 MCG/ACT NA SUSP 1 spray to each nostril twice daily regardless of symptoms   . Ofloxacin 0.3 % Otic Solution Place 10 drops into right ear 2 times  a day.   . Zolpidem Tartrate 5 MG Oral Tab Take 1 tablet (5 mg) by mouth at bedtime as needed.   Marland Kitchen. ZYRTEC 10 MG OR TABS 1 PO QD     No current facility-administered medications for this visit.       ROS  Negative for vision changes, fevers, ear drainage, ear tenderness, congestion, sore throat, or lymphadenopathy.     Exam  BP 124/78  Pulse 74  Resp 12  Ht 5\' 1"  (1.549 m)  Wt 170 lb (77.111 kg)  BMI 32.14 kg/m2  LMP 10/23/2013  GENERAL: very pleasant, NAD.  EYES: EOMI, no conjunctival injection.   HENT: mastoid process, helix, and tragus are non-tender and without overlying erythema, right external auditory canal is clear, there is a white/yellow substance at the location of the tympanic membrane, with possible perforation of the membrane. Left auditory canal and tympanic membrane are clear. Throat without erythema or exudate.   LYMPH: no occipital or cervical lymphadenopathy.       Assessment/Plan  53 year old female who presents with persistent discomfort and muffled hearing of the right ear.     (382.9) Acute otitis media, right  (primary encounter diagnosis)  Suspect she may have developed tympanic membrane perforation as a result of acute otitis media. Foreign body/other lesion is  another consideration, though history is not suggestive of these diagnoses. Symptoms have now been present for one month and have failed to improve with oral antibiotic therapy.   - Ofloxacin 0.3 % Otic Solution, 10 drops to right ear BID to cover for ruptured tympanic membrane  - REFERRAL TO OTO-HEAD NECK SURGERY       Gabriel Earing, MD

## 2013-12-08 ENCOUNTER — Ambulatory Visit (INDEPENDENT_AMBULATORY_CARE_PROVIDER_SITE_OTHER): Payer: BLUE CROSS/BLUE SHIELD | Admitting: Otolaryngology

## 2013-12-08 ENCOUNTER — Encounter (INDEPENDENT_AMBULATORY_CARE_PROVIDER_SITE_OTHER): Payer: Self-pay | Admitting: Otolaryngology

## 2013-12-08 VITALS — BP 124/78 | Ht 61.0 in | Wt 170.0 lb

## 2013-12-08 DIAGNOSIS — H60399 Other infective otitis externa, unspecified ear: Secondary | ICD-10-CM

## 2013-12-08 DIAGNOSIS — H6091 Unspecified otitis externa, right ear: Secondary | ICD-10-CM

## 2013-12-08 MED ORDER — CIPROFLOXACIN-DEXAMETHASONE 0.3-0.1 % OT SUSP
4.0000 [drp] | Freq: Two times a day (BID) | OTIC | Status: AC
Start: 2013-12-08 — End: 2013-12-15

## 2013-12-08 NOTE — Progress Notes (Signed)
12/08/2013    Seen in consultation at the request of:      Dear Dr. Mervyn SkeetersJayne-Jensen,      Thank you for trusting me with the care of Connie [name].     Please feel free to call me with any questions (office: [phone]).      Sincerely,                 Doreene NestKaren  Lin, MD         CONSULTATION      PATIENT:  Connie [name]  DOB:  [birth year]  MRN:  [id number]      CHIEF COMPLAINT/HPI:  Patient is a 53 year old female who presented with right hearing loss for 1 month.  Symptoms are mild-moderate,   continuous, and gradually improved.  There is no obvious etiology.  Relieving factors include hydrogen peroxide,   Augmentin, and Floxin otic drops.  Associated symptoms include headache that is resolving and clogged ear   sensation.  She denies otalgia, otorrhea, URI, tinnitus, and vertigo.  The patient has no past history of otological   surgery, otological trauma, significant noise exposure, RAOM, and family history of hearing loss.      Prior testing: none    Audiogram:     CT temporal bone:     MRI brain/internal auditory canals:        HEARING AID USE:  None      PMHX:  Past Medical History   Diagnosis Date   . No significant past medical history      PSHX:  Past Surgical History   Procedure Laterality Date   . No prior surgeries       FHX:   Family History   Problem Relation Age of Onset   . Heart Disease Father    . Lipids Mother    . Other Father      Crohns     SHX:  History     Social History   . Marital Status: Married     Spouse Name: N/A     Number of Children: N/A   . Years of Education: N/A     Occupational History   . Not on file.     Social History Main Topics   . Smoking status: Former Smoker -- 0.50 packs/day for 10 years     Types: Cigarettes     Quit date: 08/27/1989   . Smokeless tobacco: Never Used   . Alcohol Use: Yes      Comment: 1-2 every coulpe of weeks   . Drug Use: No   . Sexually Active: Yes -- Female partner(s)      Comment: mutually mongamous since 1987     Other Topics Concern   . Special  Diet Yes     Weight Watchers off and on   . Exercise No     swimming     Social History Narrative    Mom for 1 child, husband is a Administratorcreative writer.  Has lived in [place] since 1993.     Pt used to do glass work                ALLERGIES:  has No Known Allergies.    MEDICATIONS:  has a current medication list which includes the following prescription(s): aerochamber mv, albuterol sulfate hfa, amoxicillin-pot clavulanate, ciprofloxacin-dexamethasone, desonide, flovent hfa, flonase, ofloxacin, zolpidem tartrate, and zyrtec.        REVIEW OF SYSTEMS:    Constitutional:  denies fatigue, fever, chills, sweats   Eye: negative  ENT: + hearing loss, infection.  Denies excessive cerumen,otalgia, tinnitus, vertigo, noise exposure, Nose/Sinus: anosmia/hyposmia, nasal drainage, epistaxis, facial pain, nasal congestion, nasal obstruction, rhinorrhea, sinusitis, sneezing, Throat: taste change, voice change, hoarseness, dysphagia, lump in throat, odynophagia, post-nasal drainage, sore tongue, mouth sores, pharyngitis, snoring, tooth pain   Cardiovascular: negative   Respiratory:  negative  Gastrointestinal:  Negative  Endocrine:  negative    Neurological: negative  Psychiatric:  negative   Integumentary: negative and no rashes nor lesions of concern   Musculoskeletal: negative   Hematologic: negative  Immunologic: no known environmental allergies         PHYSICAL EXAMINATION:  Filed Vitals:    12/08/13 1205   BP: 124/78   Height: 5\' 1"  (1.549 m)   Weight: 170 lb (77.111 kg)        General: Well developed, appearing stated age and in no acute distress.  Participated in the patient interview appropriately.  Eyes: Clear conjunctive, normal lids. Pupils equal, round and reactive to light.  Extraocular movements intact.  Ears: visualized by microscopy.    Right: Pinna unremarkable.  External ear canal with yellow debris.  Tympanic membrane intact and dull, thickened/inflamed.  + hearing loss.     Left: Pinna unremarkable.  External ear  canal without masses or skin abnormality. Mild cerumen. Tympanic membrane intact and mobile without retraction or perforation. Middle ear without fluid. No hearing loss.    Nose:  Nasal mucosa normal with clear mucus.  Midline septum with no polyps or lesions.  Mild turbinate hypertrophy.  OP: Clear without masses or ulcerations. Normal lips, teeth, and gums.  Normal hard palate, soft palate, and posterior pharynx. No parotid or submandibular gland tenderness, masses, or swelling.    HP:  Clear without mucosal abnormalities or masses  Neck: Supple and without masses, midline trachea, normal thyroid without enlargement, tenderness, and masses.  Lymphatic: normal submandibular, cervical, and supraclavicular lymph nodes    Skin: No rashes   Neuro:  CN II-XII grossly intact (VII Movements of facial expression bilaterally intact and symmetrical)  Cardiovascular: Regular rate and rhythm, normal carotid pulses  Lungs:Clear to auscultation, normal effort  Extremities: No edema      Ear Procedure Note:  Consent obtained. Procedure discussed. Questions answered. Correct patient identified.    Right:  Microscope used.  Yellow debris completely removed.    Removal method:  Suction    Patient tolerated procedure well.  Patient noted significant improvement in hearing.         IMPRESSION/PLAN: AD otitis externa/media  Ciprodex otic drops  Consider audiogram if hearing does not return to baseline        Doreene Nest, MD      Cc:   Ferrucci, Kristopher Glee, MD  [address]  [place], [place] [address]

## 2013-12-08 NOTE — Patient Instructions (Signed)
External Ear Infection [Adult]    This is an infection in the ear canal due to an overgrowth of bacteria or fungus. This often occurs a few days after water gets trapped in the ear canal (swimming or bathing). It may also occur after cleaning too deeply in the ear canal with a cotton swab or other object. Sometimes hair care products get into the ear canal and cause this problem.  There may be itching, redness, drainage, or swelling of the ear canal and temporary loss of hearing.  Home Care:   Do not try to clean the ear canal. That could push pus and bacteria deeper into the canal.   Use the drops prescribed to reduce swelling and fight the infection. If an EAR WICK was placed in the ear canal, apply drops right onto the end of the wick. The wick will draw the medicine into the ear canal even if it is swollen closed.   Do not allow water to get into your ear when bathing. No swimming during this time.   A cotton ball may be loosely placed in the outer ear to absorb any drainage.   You may use acetaminophen (Tylenol) or ibuprofen (Motrin, Advil) to control pain, unless another medicine was prescribed. [NOTE: If you have chronic liver or kidney disease or ever had a stomach ulcer or GI bleeding, talk with your doctor before using these medicines.]  Preventing Future Infections:  You can usually avoid this problem by using an eardrop that removes the water from your ear canal when you feel there is water trapped there. You can get these drops over the counter (Swim Ear, Aqua Ear and other brands).  Follow Up  with your doctor or this facility in one week or as instructed by our staff.  Get Prompt Medical Attention  if any of the following occur:   Ear pain becomes worse or does not begin to improve after 3 days of treatment   Redness or swelling of the outer ear occurs or gets worse   Headache, painful or stiff neck,   Feeling drowsy or confused   Fever of 100.4F (38C) or higher, or as directed by your  healthcare provider   Seizure   2000-2013 [employer], [address], [place] [address]. All rights reserved. This information is not intended as a substitute for professional medical care. Always follow your healthcare professional's instructions.

## 2013-12-16 ENCOUNTER — Encounter (INDEPENDENT_AMBULATORY_CARE_PROVIDER_SITE_OTHER): Payer: Self-pay | Admitting: Otolaryngology

## 2013-12-16 ENCOUNTER — Encounter (INDEPENDENT_AMBULATORY_CARE_PROVIDER_SITE_OTHER): Payer: BLUE CROSS/BLUE SHIELD | Admitting: Otolaryngology

## 2014-01-29 LAB — HM MAMMOGRAPHY

## 2014-01-29 LAB — HM PAP SMEAR

## 2014-02-10 ENCOUNTER — Ambulatory Visit: Payer: Self-pay | Admitting: Family Medicine

## 2014-02-23 ENCOUNTER — Other Ambulatory Visit (INDEPENDENT_AMBULATORY_CARE_PROVIDER_SITE_OTHER): Payer: Self-pay | Admitting: Internal Medicine

## 2014-02-23 DIAGNOSIS — L409 Psoriasis, unspecified: Secondary | ICD-10-CM

## 2014-02-23 DIAGNOSIS — G4700 Insomnia, unspecified: Secondary | ICD-10-CM

## 2014-02-24 MED ORDER — DESONIDE 0.05 % EX OINT
1.0000 | TOPICAL_OINTMENT | Freq: Two times a day (BID) | CUTANEOUS | Status: DC | PRN
Start: 2014-02-24 — End: 2016-11-01

## 2014-02-24 MED ORDER — ZOLPIDEM TARTRATE 5 MG OR TABS
5.0000 mg | ORAL_TABLET | Freq: Every evening | ORAL | Status: DC | PRN
Start: 2014-02-24 — End: 2014-04-29

## 2014-02-24 NOTE — Telephone Encounter (Signed)
I left this in my out box.

## 2014-02-24 NOTE — Telephone Encounter (Signed)
Medication was called in.  Closing TE

## 2014-02-24 NOTE — Telephone Encounter (Signed)
Your patient has requested:  Drug: Zolpidem 5mg   Directions: 1 QHS prn  Qty:30  Last filled: 01/27/14  [facility]     If the prescription is approved please make sure the signed hard copy is faxed to the requesting pharmacy or called in.   If it is not approved please let the patient & requesting pharmacy know.

## 2014-04-27 ENCOUNTER — Encounter: Payer: Self-pay | Admitting: Internal Medicine

## 2014-04-27 ENCOUNTER — Telehealth (INDEPENDENT_AMBULATORY_CARE_PROVIDER_SITE_OTHER): Payer: Self-pay | Admitting: Internal Medicine

## 2014-04-27 DIAGNOSIS — G4700 Insomnia, unspecified: Secondary | ICD-10-CM

## 2014-04-27 NOTE — Telephone Encounter (Signed)
CONFIRMED PHONE NUMBER: [phone]  CALLERS FIRST AND LAST NAME: Connie [name]  FACILITY NAME: n/a TITLE: n/a  CALLERS RELATIONSHIP:Self  RETURN CALL: OK to leave detailed message with anyone that answers     SUBJECT: General Message   REASON FOR REQUEST: Urgent refill    MESSAGE: Patient called stating that she's leaving town tomorrow morning and is requesting a refill to be filled today of her Zolpidem to be sent to [facility].

## 2014-04-27 NOTE — Telephone Encounter (Signed)
Patient called in to check on status of medication refill. She is leaving out of town tomorrow. Please call back with status as soon as possible. Thank you

## 2014-04-27 NOTE — Telephone Encounter (Deleted)
Your patient has requested:  Drug:  Zolpidem 5mg   Directions:  Take 1 tablet (5mg ) by mouth at bedtime as needed   Qty: 30  Last filled:  03/26/14  [facility]     If the prescription is approved please make sure the signed hard copy is faxed to the requesting pharmacy or called in.   If it is not approved please let the patient & requesting pharmacy know.

## 2014-04-29 ENCOUNTER — Other Ambulatory Visit (INDEPENDENT_AMBULATORY_CARE_PROVIDER_SITE_OTHER): Payer: Self-pay | Admitting: Internal Medicine

## 2014-04-29 DIAGNOSIS — G4700 Insomnia, unspecified: Secondary | ICD-10-CM

## 2014-04-29 MED ORDER — ZOLPIDEM TARTRATE 5 MG OR TABS
5.0000 mg | ORAL_TABLET | Freq: Every evening | ORAL | Status: DC | PRN
Start: 2014-04-29 — End: 2014-07-14

## 2014-04-29 NOTE — Telephone Encounter (Signed)
LVM informing patient.

## 2014-04-29 NOTE — Telephone Encounter (Signed)
Faxed back with confirmation

## 2014-04-29 NOTE — Telephone Encounter (Signed)
I left this in my out box.

## 2014-04-29 NOTE — Telephone Encounter (Signed)
Last OV 12/05/13 with Dr. Mervyn Skeeters  Patient is requesting a refill of Ambien  Pharmacy loaded, please advise

## 2014-04-29 NOTE — Telephone Encounter (Signed)
CONFIRMED PHONE NUMBER: [phone]  CALLERS FIRST AND LAST NAME: Boneta Lucks  FACILITY NAME: [facility] TITLE: na  CALLERS RELATIONSHIP:OTHER: Pharmacy  RETURN CALL: Not OK to leave any message     SUBJECT: General Message   REASON FOR REQUEST: Refill    MESSAGE: Patient is requesting refill of Ambien.

## 2014-04-30 NOTE — Telephone Encounter (Signed)
Rx filled 6.3.15.  Verified with pharmacy that Rx was received and ready for pick up.  LM that Rx is ready for pick up.

## 2014-07-14 ENCOUNTER — Other Ambulatory Visit: Payer: Self-pay | Admitting: Internal Medicine

## 2014-07-14 DIAGNOSIS — G4700 Insomnia, unspecified: Secondary | ICD-10-CM

## 2014-07-14 MED ORDER — ZOLPIDEM TARTRATE 5 MG OR TABS
5.0000 mg | ORAL_TABLET | Freq: Every evening | ORAL | Status: DC | PRN
Start: 2014-07-14 — End: 2014-09-03

## 2014-07-14 NOTE — Telephone Encounter (Signed)
Faxed to pharmacy with confirmation

## 2014-07-14 NOTE — Telephone Encounter (Signed)
Your patient has requested:  Drug:  Zolpidem 5mg   Directions:  Take 1 tablet (5 mg) by mouth at bedtime as needed.  Qty: 30  Last filled:  06/13/14  [facility]     If the prescription is approved please make sure the signed hard copy is faxed to the requesting pharmacy or called in.   If it is not approved please let the patient & requesting pharmacy know.

## 2014-07-14 NOTE — Telephone Encounter (Signed)
I left this in my out box.

## 2014-09-02 ENCOUNTER — Telehealth (INDEPENDENT_AMBULATORY_CARE_PROVIDER_SITE_OTHER): Payer: Self-pay | Admitting: Internal Medicine

## 2014-09-02 DIAGNOSIS — G4700 Insomnia, unspecified: Secondary | ICD-10-CM

## 2014-09-02 NOTE — Telephone Encounter (Signed)
CONFIRMED PHONE NUMBER: [phone] or mobile [phone] if no answer   CALLERS FIRST AND LAST NAME: Connie DustmanMary Leanora [name]  FACILITY NAME: na TITLE: na  CALLERS RELATIONSHIP:Self  RETURN CALL: Not OK to leave any message     SUBJECT: Prescription Management   REASON FOR REQUEST: Patient is leaving the country from 09/11/14 to 09/22/14 and will run out of her medication while over there. She is requesting a callback from the clinic to discuss getting a early refill in order to make it so she doesn't run out while [place] seas. Please callback as soon as possible.    MEDICATION: Zolpidem  CONCERN/QUESTION: Requesting early refill due to her leaving the country   PRESCRIBING PROVIDER: Romelle Starcherynthia Ferrucci  DOSE TAKING NOW: 5 mg  PHARMACY NAME, LOCATION, & PHONE #: na

## 2014-09-03 MED ORDER — ZOLPIDEM TARTRATE 5 MG OR TABS
5.0000 mg | ORAL_TABLET | Freq: Every evening | ORAL | Status: DC | PRN
Start: 2014-09-03 — End: 2014-10-08

## 2014-09-03 NOTE — Telephone Encounter (Signed)
Last OV 12/05/13 with Dr. Mervyn SkeetersJayne-Jensen  Patient is leaving the country from 09/11/14 to 09/22/14 and will run out of her medication while away  She is requesting an early refill in order to avoid running out   Medication: Zolpidem  Please advise

## 2014-09-03 NOTE — Telephone Encounter (Signed)
Faxed w/confirmation.

## 2014-09-03 NOTE — Telephone Encounter (Signed)
LVM informing patient  Routing to FD for faxing

## 2014-09-03 NOTE — Telephone Encounter (Signed)
Prescription has been printed, signed, and placed in the out box.

## 2014-09-08 ENCOUNTER — Other Ambulatory Visit (INDEPENDENT_AMBULATORY_CARE_PROVIDER_SITE_OTHER): Payer: Self-pay | Admitting: Internal Medicine

## 2014-09-08 NOTE — Telephone Encounter (Signed)
CONFIRMED PHONE NUMBER: [phone]  CALLERS FIRST AND LAST NAME: Connie DustmanMary Leanora [name]  FACILITY NAME: na TITLE: na  CALLERS RELATIONSHIP:Self  RETURN CALL: Detailed message on voicemail only     SUBJECT: General Message   REASON FOR REQUEST: Refill not received    MESSAGE: Patient has been in contact with pharmacy, did not receive refill request (see TE 10/7.) Please refax.    Thank you!

## 2014-09-09 NOTE — Telephone Encounter (Signed)
LVM rx sent to pharmacy

## 2014-10-08 ENCOUNTER — Other Ambulatory Visit: Payer: Self-pay | Admitting: Internal Medicine

## 2014-10-08 DIAGNOSIS — G4700 Insomnia, unspecified: Secondary | ICD-10-CM

## 2014-10-08 NOTE — Telephone Encounter (Signed)
Patient calling to report that she called medication refill request to her pharmacy a couple of days ago. She would like to get her R/x by tomorrow. She is also requesting that R/x be for 3 months instead of the 1 month it is now. She asked for this with the last refill and she still received only 1 month supply. Please call her at [phone]. Detailed voicemail message is ok.

## 2014-10-08 NOTE — Telephone Encounter (Signed)
Patient has requested:  Drug: Zolpidem 5mg   Directions: -----  Rob BuntingQty: 30  Last filled: 09/09/14  [facility]     If the prescription is approved please make sure the signed hard copy is faxed to the requesting pharmacy or called in.   If it is not approved please let the patient & requesting pharmacy know.

## 2014-10-09 MED ORDER — ZOLPIDEM TARTRATE 5 MG OR TABS
5.0000 mg | ORAL_TABLET | Freq: Every evening | ORAL | Status: DC | PRN
Start: 2014-10-09 — End: 2015-01-26

## 2014-10-09 NOTE — Telephone Encounter (Signed)
Faxed back with confirmation

## 2014-10-09 NOTE — Telephone Encounter (Signed)
I left this in my out box.

## 2015-01-26 ENCOUNTER — Telehealth (INDEPENDENT_AMBULATORY_CARE_PROVIDER_SITE_OTHER): Payer: Self-pay | Admitting: Internal Medicine

## 2015-01-26 DIAGNOSIS — G4700 Insomnia, unspecified: Secondary | ICD-10-CM

## 2015-01-26 NOTE — Telephone Encounter (Addendum)
[facility]oke to pharmacy tech at [facility].  Patient's current Rx was written 10/09/14 for 30 tabs w/ 3 refills. (120 total tablets - should have lasted patient until  02/06/15)  First fill was 10/09/14  1st refill: 11/02/14  2nd refill:11/27/14  3rd refill: 12/24/14    Patient was filling this prescription early each month.    Routing to provider, please advise.

## 2015-01-26 NOTE — Telephone Encounter (Signed)
Your patient has requested:  Drug: Zolpidem 5 mg tablet  Directions: Take 1 tablet (5 mg) by mouth at bedtime as needed  Qty: 30 tablets  Last filled: 12/24/14  [facility]     If the prescription is approved please make sure the signed hard copy is faxed to the requesting pharmacy or called in.   If it is not approved please let the patient & requesting pharmacy know.

## 2015-01-26 NOTE — Telephone Encounter (Signed)
Can we call [facility]? She should have 1 refill left. Thanks, Dr. Jesse SansPaesch

## 2015-01-26 NOTE — Telephone Encounter (Signed)
CONFIRMED PHONE NUMBER: [phone]  CALLERS FIRST AND LAST NAME: Dyane DustmanMary Leanora Wheat  FACILITY NAME: na TITLE: na  CALLERS RELATIONSHIP:Self  RETURN CALL: General message OK     SUBJECT: Refill Request        REASON FOR REQUEST:Refill Request    MEDICATION(S): Zolpidem  MEDICATION (S) NEEDED BY: with 3 days  PRESCRIBING PROVIDER: Ferrucci  [facility] NAME AND LOCATION[facility] [address] [place]  PHARMACY PHONE AND FAX NUMBER:  [phone]    ADDITIONAL INFORMATION: NA

## 2015-01-27 MED ORDER — ZOLPIDEM TARTRATE 5 MG OR TABS
5.0000 mg | ORAL_TABLET | Freq: Every evening | ORAL | Status: DC | PRN
Start: 2015-01-27 — End: 2015-03-02

## 2015-01-27 NOTE — Telephone Encounter (Signed)
rf approved.  Please phone in.       Please let her know that she should limit this to one per day. Please let her know she is due for HM and followup with me.

## 2015-01-27 NOTE — Telephone Encounter (Signed)
LVM for patient informing them that Rx has been sent in for 1 month.  Advised patient that PCP states patient is due for health maintenance and follow up visit with her.    Routing to FD, please follow up attempting to schedule OV for patient with PCP.    CCR: If patient calls back, please schedule follow up with PCP or transfer to FD for scheduling.

## 2015-02-02 LAB — CBC AND DIFFERENTIAL
HEMATOCRIT: 42 % (ref 36–46)
HEMOGLOBIN: 14.1 g/dL (ref 12.0–16.0)
Neutrophils Absolute: 3 /uL
Platelets: 226 10*3/uL (ref 150–399)
WBC: 5.9 10*3/mL

## 2015-02-02 LAB — BASIC METABOLIC PANEL: Potassium: 4.8 mmol/L (ref 3.4–5.3)

## 2015-03-02 ENCOUNTER — Telehealth (INDEPENDENT_AMBULATORY_CARE_PROVIDER_SITE_OTHER): Payer: Self-pay | Admitting: Internal Medicine

## 2015-03-02 DIAGNOSIS — G4700 Insomnia, unspecified: Secondary | ICD-10-CM

## 2015-03-02 MED ORDER — ZOLPIDEM TARTRATE 5 MG OR TABS
5.0000 mg | ORAL_TABLET | Freq: Every evening | ORAL | Status: DC | PRN
Start: 2015-03-02 — End: 2015-03-19

## 2015-03-02 NOTE — Telephone Encounter (Signed)
CCR: If patient calls back please schedule appointment per message below. Thank you    Faxed back with confirmation    LVM with spouse, patient is out of town and will call to schedule appointment when she returns.

## 2015-03-02 NOTE — Telephone Encounter (Signed)
rf approved x one. I left this in my out box.   Please let her know that she is due for followup with me and HME.

## 2015-03-02 NOTE — Telephone Encounter (Signed)
Last OV 12/05/13  Last refill 01/27/15    Not seen in awhile, last refill possible indication of OV needed. Please advise. Med and pharmacy T'd up if appropriate.

## 2015-03-16 ENCOUNTER — Encounter (INDEPENDENT_AMBULATORY_CARE_PROVIDER_SITE_OTHER): Payer: BLUE CROSS/BLUE SHIELD | Admitting: Internal Medicine

## 2015-03-19 ENCOUNTER — Ambulatory Visit (INDEPENDENT_AMBULATORY_CARE_PROVIDER_SITE_OTHER): Payer: BLUE CROSS/BLUE SHIELD | Admitting: Internal Medicine

## 2015-03-19 VITALS — BP 133/81 | HR 79 | Temp 98.9°F | Resp 16 | Ht 61.0 in | Wt 168.0 lb

## 2015-03-19 DIAGNOSIS — N951 Menopausal and female climacteric states: Secondary | ICD-10-CM

## 2015-03-19 DIAGNOSIS — G4700 Insomnia, unspecified: Secondary | ICD-10-CM

## 2015-03-19 DIAGNOSIS — Z1239 Encounter for other screening for malignant neoplasm of breast: Secondary | ICD-10-CM

## 2015-03-19 MED ORDER — ESTRADIOL 0.075 MG/24HR TD PTTW
1.0000 | MEDICATED_PATCH | TRANSDERMAL | Status: DC
Start: 2015-03-22 — End: 2015-07-27

## 2015-03-19 MED ORDER — MEDROXYPROGESTERONE ACETATE 2.5 MG OR TABS
2.5000 mg | ORAL_TABLET | Freq: Every day | ORAL | Status: DC
Start: 2015-03-19 — End: 2015-05-29

## 2015-03-19 MED ORDER — ZOLPIDEM TARTRATE 5 MG OR TABS
5.0000 mg | ORAL_TABLET | Freq: Every evening | ORAL | Status: DC | PRN
Start: 2015-03-19 — End: 2016-09-20

## 2015-03-19 NOTE — Progress Notes (Signed)
Reason for visit:   Chief Complaint   Patient presents with   . Medication Management     ambien refill, hot flashes        Cervical screening/PAP: NO  Mammo: YES:  [facility]  Colon Screen: NO  Have you seen a specialist since your last visit: NO     If yes, Name and location and date.: N/A     HM Due:   Health Maintenance   Topic Date Due   . Hepatitis C Screen  June 12, 1961   . HIV Screen  June 12, 1961   . Pap Smear  02/23/2014   . Mammogram  05/18/2014   . Cholesterol Test  03/04/2018   . Colonoscopy  04/11/2021   . Tetanus Vaccine  07/31/2021   . Influenza Vaccine  Completed              No future appointments.

## 2015-03-19 NOTE — Patient Instructions (Addendum)
Please read: StatMob.plhttp://www.menopause.org/publications/consumer-publications/-em-menopause-guidebook-em-8th-edition    You can try over the counter diphenhydramine (Benadryl) 25-50 mg at bedtime as needed insomnia      Let's schedule your wellness in about 2 months with a pap smear.

## 2015-03-19 NOTE — Progress Notes (Signed)
Patient Referred By: No ref. provider found  Patient's PCP: Rosalie DoctorFerrucci, Luwanna Brossman Grace     Subjective:  Patient is a 54 year old, female , here to discuss Follow Up Visit; and Medication Management        HPI   (N95.1) Hot flash, menopausal  (primary encounter diagnosis)  (G47.00) Insomnia, unspecified type  She notes the zolpidem has been less and less effective.  It might help her fall asleep but she has frequent awakenings from sleep.    She notes over the past many months she may have had 2 periods.  She notes in the past week she has had frequent hot flashes.  These occur frequently overnight and have disrupted her sleep by 2-3 hours.    We reviewed her 2013 study and consultation.  She did try CPAP briefly but did not pursue this CPAP further.  In short at that study she was found to have mild obstructive sleep apnea with an apnea-hypopnea index of 5-6      (Z12.39) Screening for breast cancer   This is due.    ROS    The following portions of the patient's history were reviewed with the patient and updated as appropriate: problem list, current medications, allergies, past medical history, past surgical history, past social history and past family history.    Objective:  Physical Exam   Constitutional: She is well-developed, well-nourished, and in no distress. No distress.   HENT:   Head: Normocephalic and atraumatic.   Nursing note and vitals reviewed.         Assessment and Plan:   Diagnoses and all orders for this visit:    Hot flash, menopausal  Orders:  -     Estradiol (VIVELLE-DOT) 0.075 MG/24HR Transdermal PATCH BIWEEKLY; Place 1 patch on the skin 2 times a week.  -     MedroxyPROGESTERone Acetate (PROVERA) 2.5 MG Oral Tab; Take 1 tablet (2.5 mg) by mouth daily.  We reviewed the potential medication side effects of estrogen and progesterone.  We reviewed that it is optimal that this is discontinued by age 54.  She will schedule for her Pap smear in about 2 months will follow-up at that point.  Insomnia,  unspecified type  Orders:  -     Zolpidem Tartrate 5 MG Oral Tab; Take 1 tablet (5 mg) by mouth at bedtime as needed.  -     REFERRAL TO SLEEP DISORDER CTR  We reviewed that outcomes for cognitive behavioral therapy for insomnia really very good.  She's been dissatisfied with the results with zolpidem.  She is questions about over-the-counter medications.  Attention is directed to patient instructions.    I anticipate that managing her hot flashes will improve her insomnia but.  Screening for breast cancer  Orders:  -     SCREENING MAMMOGRAPHY BILATERAL       Time spent for this visit was 15 min, greater than 50% of which was face to face counseling on (N95.1) Hot flash, menopausal  (primary encounter diagnosis)  (G47.00) Insomnia, unspecified type  (Z12.39) Screening for breast cancer. Content of counseling is summarized in patient instructions.

## 2015-04-09 ENCOUNTER — Encounter (INDEPENDENT_AMBULATORY_CARE_PROVIDER_SITE_OTHER): Payer: Self-pay | Admitting: Internal Medicine

## 2015-05-03 ENCOUNTER — Telehealth: Payer: Self-pay | Admitting: Family Medicine

## 2015-05-03 NOTE — Telephone Encounter (Signed)
Pt stated she has to have a chest X ray for clearance for her new job to go out of the country. Pt is scheduled for a CPE on 05/13/15 @ 9 am and would like this order to be ready so she can go right after her appt here to get the xray. Thanks TNP

## 2015-05-06 DIAGNOSIS — H9209 Otalgia, unspecified ear: Secondary | ICD-10-CM | POA: Insufficient documentation

## 2015-05-06 DIAGNOSIS — R202 Paresthesia of skin: Secondary | ICD-10-CM | POA: Insufficient documentation

## 2015-05-06 DIAGNOSIS — M1990 Unspecified osteoarthritis, unspecified site: Secondary | ICD-10-CM | POA: Insufficient documentation

## 2015-05-08 ENCOUNTER — Ambulatory Visit (INDEPENDENT_AMBULATORY_CARE_PROVIDER_SITE_OTHER): Payer: BLUE CROSS/BLUE SHIELD | Admitting: Family Medicine

## 2015-05-08 ENCOUNTER — Encounter (INDEPENDENT_AMBULATORY_CARE_PROVIDER_SITE_OTHER): Payer: Self-pay | Admitting: Family Medicine

## 2015-05-08 VITALS — BP 122/81 | HR 81 | Temp 98.1°F | Resp 12 | Wt 177.2 lb

## 2015-05-08 DIAGNOSIS — T148 Other injury of unspecified body region: Secondary | ICD-10-CM

## 2015-05-08 DIAGNOSIS — T148XXA Other injury of unspecified body region, initial encounter: Secondary | ICD-10-CM

## 2015-05-08 NOTE — Progress Notes (Signed)
SUBJECTIVE:    54yo with splinter in R index finger since last night.  Cleaning, foreign body is painted wood.  Small amt pus this AM.  Sore. Not systemically ill. Td UTD.        OBJECTIVE:  Filed Vitals:    05/08/15 1555   BP: 122/81   Pulse: 81   Temp: 98.1 F (36.7 C)   TempSrc: Temporal   Resp: 12   Weight: 177 lb 4 oz (80.4 kg)   SpO2: 97%     SKIN:Linear white lesion palmar aspect of finger.  Minimal surrounding erythema.    Under magnification and with splinter forceps skin was unroofed and small white FB removed.  Soaked in betadine wash and dressed with bacitracin and band-aid.    ASSESSMENT/PLAN:    Foreign body R index finger.  S/P uncomplicated removal.  RTC prn.

## 2015-05-08 NOTE — Progress Notes (Signed)
Reason for Visit: splinter    Refills? NO  Referral? NO  Letter or Form? NO  Lab Results? NO    HEALTH MAINTENANCE:  Has the patient had this done since their last visit?  Cervical screening/PAP: Will schedule appointment  Mammo: Advised due  Colon Screen: N/A    Have you seen a specialist since your last visit: No    Vaccines Due? No    HM Due:   Health Maintenance   Topic Date Due   . Hepatitis C Screen  04/07/1960   . HIV Screen  08/14/1960   . Diabetes Screen  09/22/2000   . Pap Smear  02/23/2014   . Mammogram  05/18/2014   . Cholesterol Test  03/04/2018   . Colonoscopy  04/11/2021   . Tetanus Vaccine  07/31/2021   . Influenza Vaccine  Completed

## 2015-05-13 ENCOUNTER — Ambulatory Visit
Admission: RE | Admit: 2015-05-13 | Discharge: 2015-05-13 | Disposition: A | Discharge status: Home / Self Care | Payer: No Typology Code available for payment source | Source: Ambulatory Visit | Attending: Family Medicine | Admitting: Family Medicine

## 2015-05-13 ENCOUNTER — Encounter: Payer: Self-pay | Admitting: Family Medicine

## 2015-05-13 ENCOUNTER — Ambulatory Visit (INDEPENDENT_AMBULATORY_CARE_PROVIDER_SITE_OTHER): Payer: No Typology Code available for payment source | Admitting: Family Medicine

## 2015-05-13 VITALS — BP 108/68 | HR 68 | Temp 98.1°F | Resp 16 | Ht 67.0 in | Wt 154.0 lb

## 2015-05-13 DIAGNOSIS — J449 Chronic obstructive pulmonary disease, unspecified: Secondary | ICD-10-CM | POA: Diagnosis not present

## 2015-05-13 DIAGNOSIS — Y93E6 Activity, residential relocation: Secondary | ICD-10-CM

## 2015-05-13 DIAGNOSIS — Z113 Encounter for screening for infections with a predominantly sexual mode of transmission: Secondary | ICD-10-CM

## 2015-05-13 LAB — POCT UA - GLUCOSE/PROTEIN
Glucose, UA: NEGATIVE
Protein, UA: NEGATIVE

## 2015-05-13 LAB — POCT URINALYSIS DIPSTICK
Bilirubin, UA: NEGATIVE
Blood, UA: NEGATIVE
GLUCOSE UA: NEGATIVE
Ketones, UA: NEGATIVE
LEUKOCYTES UA: NEGATIVE
NITRITE UA: NEGATIVE
PROTEIN UA: NEGATIVE
Spec Grav, UA: <=1.005
Urobilinogen, UA: 0.2
pH, UA: 7

## 2015-05-13 LAB — POCT UA - MICROALBUMIN: Microalbumin Ur, POC: NEGATIVE mg/L

## 2015-05-13 NOTE — Progress Notes (Signed)
Subjective:     Patient ID: Maria Callahan, female   DOB: Dec 10, 1960, 54 y.o.   MRN: [id number]  HPI    Travel Consult: Maria Callahan is here for travel consultation. Planned departure date: 05/27/2015          Planned return date: Never; pt is moving Countries of travel: [employer] Areas in country: urban   Accommodations: private home Purpose of travel: business/ office work Prior travel out of [place]: yes, [place] in [place], [place], [place] and [place], [place], [place]. [place]  Currently ill / Fever: no History of liver or kidney disease: no Pt needs CXR before travels.   Pt just a complete PE on 02/02/2015. Pt's last pap was 01/28/2014 and was negative. Last mammogram was 01/31/2014 and was BI-RADS 1. Last colonoscopy was 06/13/2012, internal hemorrhoids were found, otherwise WNL. Last EKG 08/13/2012.    Review of Systems  Constitutional: Positive for diaphoresis. Negative for fever, chills, activity change, appetite change, fatigue and unexpected weight change.  Neurological: Positive for numbness. Negative for dizziness, tremors, seizures, syncope, facial asymmetry, speech difficulty, weakness, light-headedness and headaches.  All other systems reviewed and are negative.  BP 108/68 mmHg  Pulse 68  Temp(Src) 98.1 F (36.7 C) (Oral)  Resp 16  Ht  (1.702 m)  Wt 154 lb (69.854 kg)  BMI 24.11 kg/m2  LMP 05/12/2014 (Approximate)   Patient Active Problem List   Diagnosis Date Noted  . Arthritis, degenerative 05/06/2015  . Ear ache 05/06/2015  . Burning or prickling sensation 05/06/2015  . Enthesopathy 02/16/2009  . Alcohol drinker 06/04/2008  . DDD (degenerative disc disease), lumbosacral 03/19/2006  . Allergic rhinitis 03/11/2004  . Alcohol dependence in remission 11/28/2003   No past medical history on file. Current Outpatient Prescriptions on File Prior to Visit  Medication Sig  . Ibuprofen-Diphenhydramine Cit (IBUPROFEN PM) 200-38 MG TABS Take 2 tablets by mouth at  bedtime as needed.  . naproxen sodium (ALEVE) 220 MG tablet Take 1 tablet by mouth as needed.   No current facility-administered medications on file prior to visit.   No Known Allergies Past Surgical History  Procedure Laterality Date  . Back surgery  07/2011    L4-L5   History   Social History  . Marital Status: Divorced    Spouse Name: N/A  . Number of Children: 1  . Years of Education: 16   Occupational History  . 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 Smoking status: Former Smoker -- 0.50 packs/day for 5 years    Quit date: 11/27/1983  . Smokeless tobacco: Never Used  . Alcohol Use: No     Comment: Former Alcoholic Quit 11/27/2011  . Drug Use: No  . Sexual Activity: Not on file   Other Topics Concern  . Not on file   Social History Narrative   Family History  Problem Relation Age of Onset  . Alzheimer's disease Paternal Grandmother   . Heart attack Father   . Atrial fibrillation Father   . Alcohol abuse Father   . Allergic rhinitis Father   . Heart disease Father   . Breast cancer Mother   . Parkinsonism Mother          Objective:   Physical Exam  Constitutional: She is oriented to person, place, and time. She appears well-developed and well-nourished.  Neurological: She is alert and oriented to person, place, and time. She has  normal strength.  Psychiatric: She has a normal mood and affect. Her speech is normal and behavior is normal. Cognition and memory are normal.          Plan:     1. Residential relocation Pt is moving to [place] for work. 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 Order labs and CXR as required.  Filled out paper work. CPE earlier this year.  Healthy.  - POCT Urinalysis Dipstick - POCT UA - Glucose/Protein - POCT UA - Microalbumin - DG Chest 2 View; Future  2. Screen for STD (sexually transmitted disease) Required to check for HIV and syphilis.  Check labs as below. - HIV antibody (with reflex) - RPR     Patient seen and examined by Leo Grosser, MD, and note scribed by Allene Dillon, CMA. I have reviewed the document for accuracy and completeness and I agree with above. Leo Grosser, MD   Lorie Phenix, MD

## 2015-05-13 NOTE — Telephone Encounter (Signed)
-----   Message from Lorie Phenix, MD sent at 05/13/2015  3:17 PM EDT ----- Normal CXR. Please notify patient.  Thanks.

## 2015-05-13 NOTE — Telephone Encounter (Signed)
Informed pt of results. Emily Drozdowski, CMA  

## 2015-05-14 LAB — RPR: RPR Ser Ql: NONREACTIVE

## 2015-05-14 LAB — HIV ANTIBODY (ROUTINE TESTING W REFLEX): HIV Screen 4th Generation wRfx: NONREACTIVE

## 2015-05-14 NOTE — Telephone Encounter (Signed)
Advised pt as below. Will come by with form after 1200. Allene Dillon, CMA

## 2015-05-14 NOTE — Telephone Encounter (Signed)
-----   Message from Lorie Phenix, MD sent at 05/14/2015  9:59 AM EDT ----- HIV, RPR and CXR all normal. Have patient come by with form so we can fill in rest for her. Thanks.

## 2015-05-28 ENCOUNTER — Telehealth (INDEPENDENT_AMBULATORY_CARE_PROVIDER_SITE_OTHER): Payer: Self-pay | Admitting: Internal Medicine

## 2015-05-28 NOTE — Telephone Encounter (Signed)
Spoke with patient and scheduled appointment for 05/29/2015 with PCP

## 2015-05-28 NOTE — Telephone Encounter (Signed)
Please have her schedule to see me.

## 2015-05-28 NOTE — Telephone Encounter (Signed)
LVM for patient to call back to schedule OV with PCP.      CCR: If patient calls back, please transfer to FD for scheduling.

## 2015-05-28 NOTE — Telephone Encounter (Signed)
Patient calling in to report that she is recently having a flare up in her left shoulder symptoms - very stiff, unable to move past certain point, cannot reach behind back.  She states that she has had an issue with her left shoulder rotary cuff for a while but has recently gotten worse, she is worried that it may be torn.  Patient states that she has discussed this with PCP before in the past, but that is has been a while - looks like last documented in 2010.  Patient reports being sent to PT to see if this would help, but had not had much luck with this.  Patient is request referral for MRI to see if something is torn.  I advised that she may need an office visit to discuss this in more detail with PCP since it looks like it has been a while since last discussed.  She is agreeable to this, but wants to see if PCP would want to refer her for MRI first.      Routing to PCP, patient needs OV to further discuss shoulder injury?

## 2015-05-29 ENCOUNTER — Ambulatory Visit (INDEPENDENT_AMBULATORY_CARE_PROVIDER_SITE_OTHER): Payer: BLUE CROSS/BLUE SHIELD | Admitting: Internal Medicine

## 2015-05-29 VITALS — BP 152/107 | HR 79 | Temp 99.4°F | Resp 16 | Wt 178.4 lb

## 2015-05-29 DIAGNOSIS — N951 Menopausal and female climacteric states: Secondary | ICD-10-CM

## 2015-05-29 DIAGNOSIS — M25512 Pain in left shoulder: Secondary | ICD-10-CM

## 2015-05-29 DIAGNOSIS — G8929 Other chronic pain: Secondary | ICD-10-CM

## 2015-05-29 DIAGNOSIS — Z1159 Encounter for screening for other viral diseases: Secondary | ICD-10-CM

## 2015-05-29 DIAGNOSIS — J45909 Unspecified asthma, uncomplicated: Secondary | ICD-10-CM

## 2015-05-29 DIAGNOSIS — Z114 Encounter for screening for human immunodeficiency virus [HIV]: Secondary | ICD-10-CM

## 2015-05-29 DIAGNOSIS — N938 Other specified abnormal uterine and vaginal bleeding: Secondary | ICD-10-CM

## 2015-05-29 LAB — CBC, DIFF
% Basophils: 1 %
% Eosinophils: 2 %
% Immature Granulocytes: 0 %
% Lymphocytes: 22 %
% Monocytes: 8 %
% Neutrophils: 67 %
Absolute Eosinophil Count: 0.1 10*3/uL (ref 0.00–0.50)
Absolute Lymphocyte Count: 1.4 10*3/uL (ref 1.00–4.80)
Basophils: 0.03 10*3/uL (ref 0.00–0.20)
Hematocrit: 41 % (ref 36–45)
Hemoglobin: 14 g/dL (ref 11.5–15.5)
Immature Granulocytes: 0.01 10*3/uL (ref 0.00–0.05)
MCH: 30.4 pg (ref 27.3–33.6)
MCHC: 34 g/dL (ref 32.2–36.5)
MCV: 89 fL (ref 81–98)
Monocytes: 0.5 10*3/uL (ref 0.00–0.80)
Neutrophils: 4.47 10*3/uL (ref 1.80–7.00)
Platelet Count: 210 10*3/uL (ref 150–400)
RBC: 4.61 10*6/uL (ref 3.80–5.00)
RDW-CV: 12.6 % (ref 11.6–14.4)
WBC: 6.51 10*3/uL (ref 4.3–10.0)

## 2015-05-29 LAB — THYROID STIMULATING HORMONE: Thyroid Stimulating Hormone: 2.151 u[IU]/mL (ref 0.400–5.000)

## 2015-05-29 MED ORDER — MEDROXYPROGESTERONE ACETATE 2.5 MG OR TABS
2.5000 mg | ORAL_TABLET | Freq: Every day | ORAL | Status: DC
Start: 2015-05-29 — End: 2016-07-06

## 2015-05-29 MED ORDER — FLUTICASONE PROPIONATE HFA 110 MCG/ACT IN AERO
1.0000 | INHALATION_SPRAY | Freq: Two times a day (BID) | RESPIRATORY_TRACT | Status: DC
Start: 2015-05-29 — End: 2015-06-02

## 2015-05-29 NOTE — Progress Notes (Signed)
Reason for visit:   Chief Complaint   Patient presents with   . Follow-Up      shoulder, can she refill progesterone early?        Cervical screening/PAP: NO  Mammo: NO, appt soon  Colon Screen: NO  Have you seen a specialist since your last visit: NO     If yes, Name and location and date.: N/A     HM Due:   Health Maintenance   Topic Date Due   . Hepatitis C Screen  January 03, 1961   . HIV Screen  January [birth year], 1961   . Diabetes Screen  09/22/2000   . Pap Smear  02/23/2014   . Mammogram  05/18/2014   . Influenza Vaccine (1) 07/29/2015   . Cholesterol Test  03/04/2018   . Colonoscopy  04/11/2021   . Tetanus Vaccine  07/31/2021              No future appointments.

## 2015-05-30 NOTE — Progress Notes (Signed)
Patient Referred By: No ref. provider found  Patient's PCP: Rosalie DoctorFerrucci, Jisele Price Grace     Subjective:  Patient is a 54 year old, female , here to discuss Follow Up Visit; and Medication Management        HPI   (M25.512,  G89.29) Chronic left shoulder pain  (primary encounter diagnosis)  She complains of may months of pain at her L shoulder . She has not had improvement with massage. She has been doing some PT exercise.     (J45.909) Unspecified asthma(493.90)  She requires a refill of medications.    (N93.8) DUB (dysfunctional uterine bleeding)    Attention is directed to the previous note in this chart.  She is occassionally a bit lateral ( half a day) with changing estrogen patch. She has had eradic vaginal spotting.     (Z11.59) Need for hepatitis C screening test  (Z11.4) Screening for HIV (human immunodeficiency virus)  These are due.    (N95.1) Hot flash, menopausal   She has had im pain of symptoms. She prefers to be able to purchse more progesterone than 3 month supply.    ROS    The following portions of the patient's history were reviewed with the patient and updated as appropriate: problem list, current medications and allergies.    Objective:  Physical Exam   Constitutional: She is well-developed, well-nourished, and in no distress. No distress.   HENT:   Head: Normocephalic and atraumatic.   Musculoskeletal:        Left shoulder: She exhibits decreased range of motion. She exhibits no tenderness, no bony tenderness, no swelling, no effusion, no crepitus, no deformity, no laceration and normal strength.   Shoulder reveals diminished abduction to about 70 deg actively and passively. Decreased internal rotation.  Full ext. rotator strength, positive  impingement sign.  No tenderness.     Nursing note and vitals reviewed.         Assessment and Plan:   Diagnoses and all orders for this visit:    Chronic left shoulder pain  Orders:  -     REFERRAL TO SPORTS AND SPINE  Likely supraspinatus tendonitis and now  adhesive capsulitis .    Unspecified asthma(493.90)  Orders:  -     Fluticasone Propionate HFA (FLOVENT HFA) 110 MCG/ACT Inhalation Aerosol; Inhale 1 puff by mouth 2 times a day. Take regardless of symptoms. Rinse mouth after use.    DUB (dysfunctional uterine bleeding)  Orders:  -     THYROID STIMULATING HORMONE  -     CBC, DIFF  -     SONO EXAM, TRANSVAGINAL  We reviewed differential diagnosis including endometrial cancer. She prefers to not proceed with testing, but will consider.     Need for hepatitis C screening test  Orders:  -     HEPATITIS C ANTIBODY WITHOUT REFLEX    Screening for HIV (human immunodeficiency virus)  Orders:  -     HIV ANTIGEN AND ANTIBODY SCRN    Hot flash, menopausal  Orders:  -     MedroxyPROGESTERone Acetate (PROVERA) 2.5 MG Oral Tab; Take 1 tablet (2.5 mg) by mouth daily.    Other orders  -     Cancel: HEPATITIS C ANTIBODY WITHOUT REFLEX  -     Cancel: HIV ANTIGEN AND ANTIBODY SCRN  -     Cancel: PAP SMEAR (OPTIONAL HPV)  -     Cancel: MAMMOGRAM, BOTH BREASTS DIAGNOSTIC       Time spent  for this visit was 25 min, greater than 50% of which was face to face counselling on (M25.512,  G89.29) Chronic left shoulder pain  (primary encounter diagnosis)  (J45.909) Unspecified asthma(493.90)  (N93.8) DUB (dysfunctional uterine bleeding)  (Z11.59) Need for hepatitis C screening test  (Z11.4) Screening for HIV (human immunodeficiency virus)  (N95.1) Hot flash, menopausal.

## 2015-06-01 LAB — HEPATITIS C AB W/O PCR: Hepatitis C Ab Result w/o PCR: NONREACTIVE

## 2015-06-01 LAB — HIV ANTIGEN AND ANTIBODY SCRN
HIV Antigen and Antibody Interpretation: NONREACTIVE
HIV Antigen and Antibody Result: NONREACTIVE

## 2015-06-02 ENCOUNTER — Telehealth (INDEPENDENT_AMBULATORY_CARE_PROVIDER_SITE_OTHER): Payer: Self-pay | Admitting: Internal Medicine

## 2015-06-02 ENCOUNTER — Encounter (INDEPENDENT_AMBULATORY_CARE_PROVIDER_SITE_OTHER): Payer: Self-pay | Admitting: Internal Medicine

## 2015-06-02 MED ORDER — BECLOMETHASONE DIPROPIONATE 40 MCG/ACT IN AERS
2.0000 | INHALATION_SPRAY | Freq: Two times a day (BID) | RESPIRATORY_TRACT | Status: DC
Start: 2015-06-02 — End: 2016-09-20

## 2015-06-02 NOTE — Telephone Encounter (Signed)
Changed to Qvar

## 2015-06-02 NOTE — Telephone Encounter (Signed)
Insurance requesting Prior Authorization for this medication.     Medication (including dosage and form): Fluticasone Propionate HFA (FLOVENT HFA) 110 MCG/ACT Inhalation Aerosol  Sig: Inhale one puff by mouth twice daily and rinse after use . Take regardless of symptons   Quantity/Day Supply: 1 inhaler per 30 days    Formulary Alternatives: Asmanex, Pulmicort, Qvar    [facility]:    [facility] [address] [place] [phone]          PA Line/Insurance Phone/Fax Number: [phone]  Member ID # and Insurance Name (if provided on form): Regence [employer]ield [id number]  Form Saved in PA Tower (Yes/No): no    Would you like to initiate a PA or prescribe an alternative? If you'd like to initiate a PA, please list the reason the patient needs this specific medication (ex: tried/failed, contraindication, etc.). Pharmacy is loaded if an alternative is prescribed.

## 2015-06-25 ENCOUNTER — Telehealth (INDEPENDENT_AMBULATORY_CARE_PROVIDER_SITE_OTHER): Payer: Self-pay | Admitting: Internal Medicine

## 2015-06-25 NOTE — Telephone Encounter (Signed)
[id number] Connie, [name])  DOS 05/08/15  CPT 10121 billed @ $844.80, $456.71 applied to deductible    Good Afternoon,    Please have coding reviewed for accuracy.  Patient had splinter removed and chart documented as uncomplicated removal.-UWP

## 2015-07-02 ENCOUNTER — Ambulatory Visit (HOSPITAL_BASED_OUTPATIENT_CLINIC_OR_DEPARTMENT_OTHER): Payer: BLUE CROSS/BLUE SHIELD

## 2015-07-02 ENCOUNTER — Other Ambulatory Visit: Payer: Self-pay | Admitting: Internal Medicine

## 2015-07-02 ENCOUNTER — Ambulatory Visit: Payer: Self-pay | Attending: Internal Medicine

## 2015-07-02 DIAGNOSIS — Z1231 Encounter for screening mammogram for malignant neoplasm of breast: Secondary | ICD-10-CM

## 2015-07-27 ENCOUNTER — Telehealth (INDEPENDENT_AMBULATORY_CARE_PROVIDER_SITE_OTHER): Payer: Self-pay | Admitting: Internal Medicine

## 2015-07-27 DIAGNOSIS — N951 Menopausal and female climacteric states: Secondary | ICD-10-CM

## 2015-07-27 MED ORDER — ESTRADIOL 0.075 MG/24HR TD PTTW
1.0000 | MEDICATED_PATCH | TRANSDERMAL | Status: DC
Start: 2015-07-29 — End: 2015-12-27

## 2015-07-27 NOTE — Telephone Encounter (Signed)
Patient is requesting a refill for     Estradiol (VIVELLE-DOT) 0.075 MG/24HR Transdermal PATCH BIWEEKLY    Last prescribed on 03/22/2015.     Rx and pharmacy loaded. Please order as you see fit.

## 2015-07-27 NOTE — Telephone Encounter (Signed)
(  TEXTING IS AN OPTION FOR [facility] CLIN[facility] ONLY)  Is this a [facility] clinic? patient out of town       RETURN CALL: Detailed message on voicemail only      SUBJECT:  Refill Request    MEDICATION(S): Estradiol (VIVELLE-DOT) 0.075 MG/24HR Transdermal PATCH BIWEEKLY  NEEDED BY: as soon as possible, patient will be out of medication 07/28/15  PRESCRIBING PROVIDER: [name], MD  PHARMACY NAME AND LOCATION: [facility]  [address] [place], [place] [address]  PHARMACY PHONE: [phone]  PHARMACY FAX NUMBER: not provided   ADDITIONAL INFORMATION: Please process this as soon as possible. Patient is requesting a call back when this request has been processed. Please follow up when you are able to. Thank you!

## 2015-09-07 NOTE — Telephone Encounter (Signed)
Adjustment made, patient notified.  Closing TE.      [id number]  06/25/15    Hi Kessia,    There is a TE open for a billing issue on this patient, Im no longer able to access TEs-  We have made an adjustment and patient is notified, can you please close it.    Thank you  Bev      Thank you,    Vangie Bicker [facility]   Specialty Team 1 Supervisor  Coding and Charge Capture  Box [phone]  [address]  [place], [place] [address]  Phone: [phone]  [email or web address]  [email or web address]

## 2015-09-29 ENCOUNTER — Encounter (INDEPENDENT_AMBULATORY_CARE_PROVIDER_SITE_OTHER): Payer: Self-pay | Admitting: Internal Medicine

## 2015-12-27 ENCOUNTER — Other Ambulatory Visit (INDEPENDENT_AMBULATORY_CARE_PROVIDER_SITE_OTHER): Payer: Self-pay | Admitting: Internal Medicine

## 2015-12-27 DIAGNOSIS — N951 Menopausal and female climacteric states: Secondary | ICD-10-CM

## 2015-12-28 MED ORDER — ESTRADIOL 0.075 MG/24HR TD PTTW
MEDICATED_PATCH | TRANSDERMAL | Status: DC
Start: 2015-12-28 — End: 2016-09-20

## 2016-01-06 ENCOUNTER — Encounter (INDEPENDENT_AMBULATORY_CARE_PROVIDER_SITE_OTHER): Payer: Self-pay | Admitting: Internal Medicine

## 2016-02-08 ENCOUNTER — Encounter (INDEPENDENT_AMBULATORY_CARE_PROVIDER_SITE_OTHER): Payer: Self-pay

## 2016-03-04 IMAGING — CR DG CHEST 2V
1 series · 2 of 2 positions shown · non-contrast
Comparison: None.

CLINICAL DATA: No known problems, relocation physical

EXAM:
CHEST  2 VIEW

[Series 1: dg chest 2 view · 0.14mm/px · 2 of 2 slices shown]
[im 1/2]
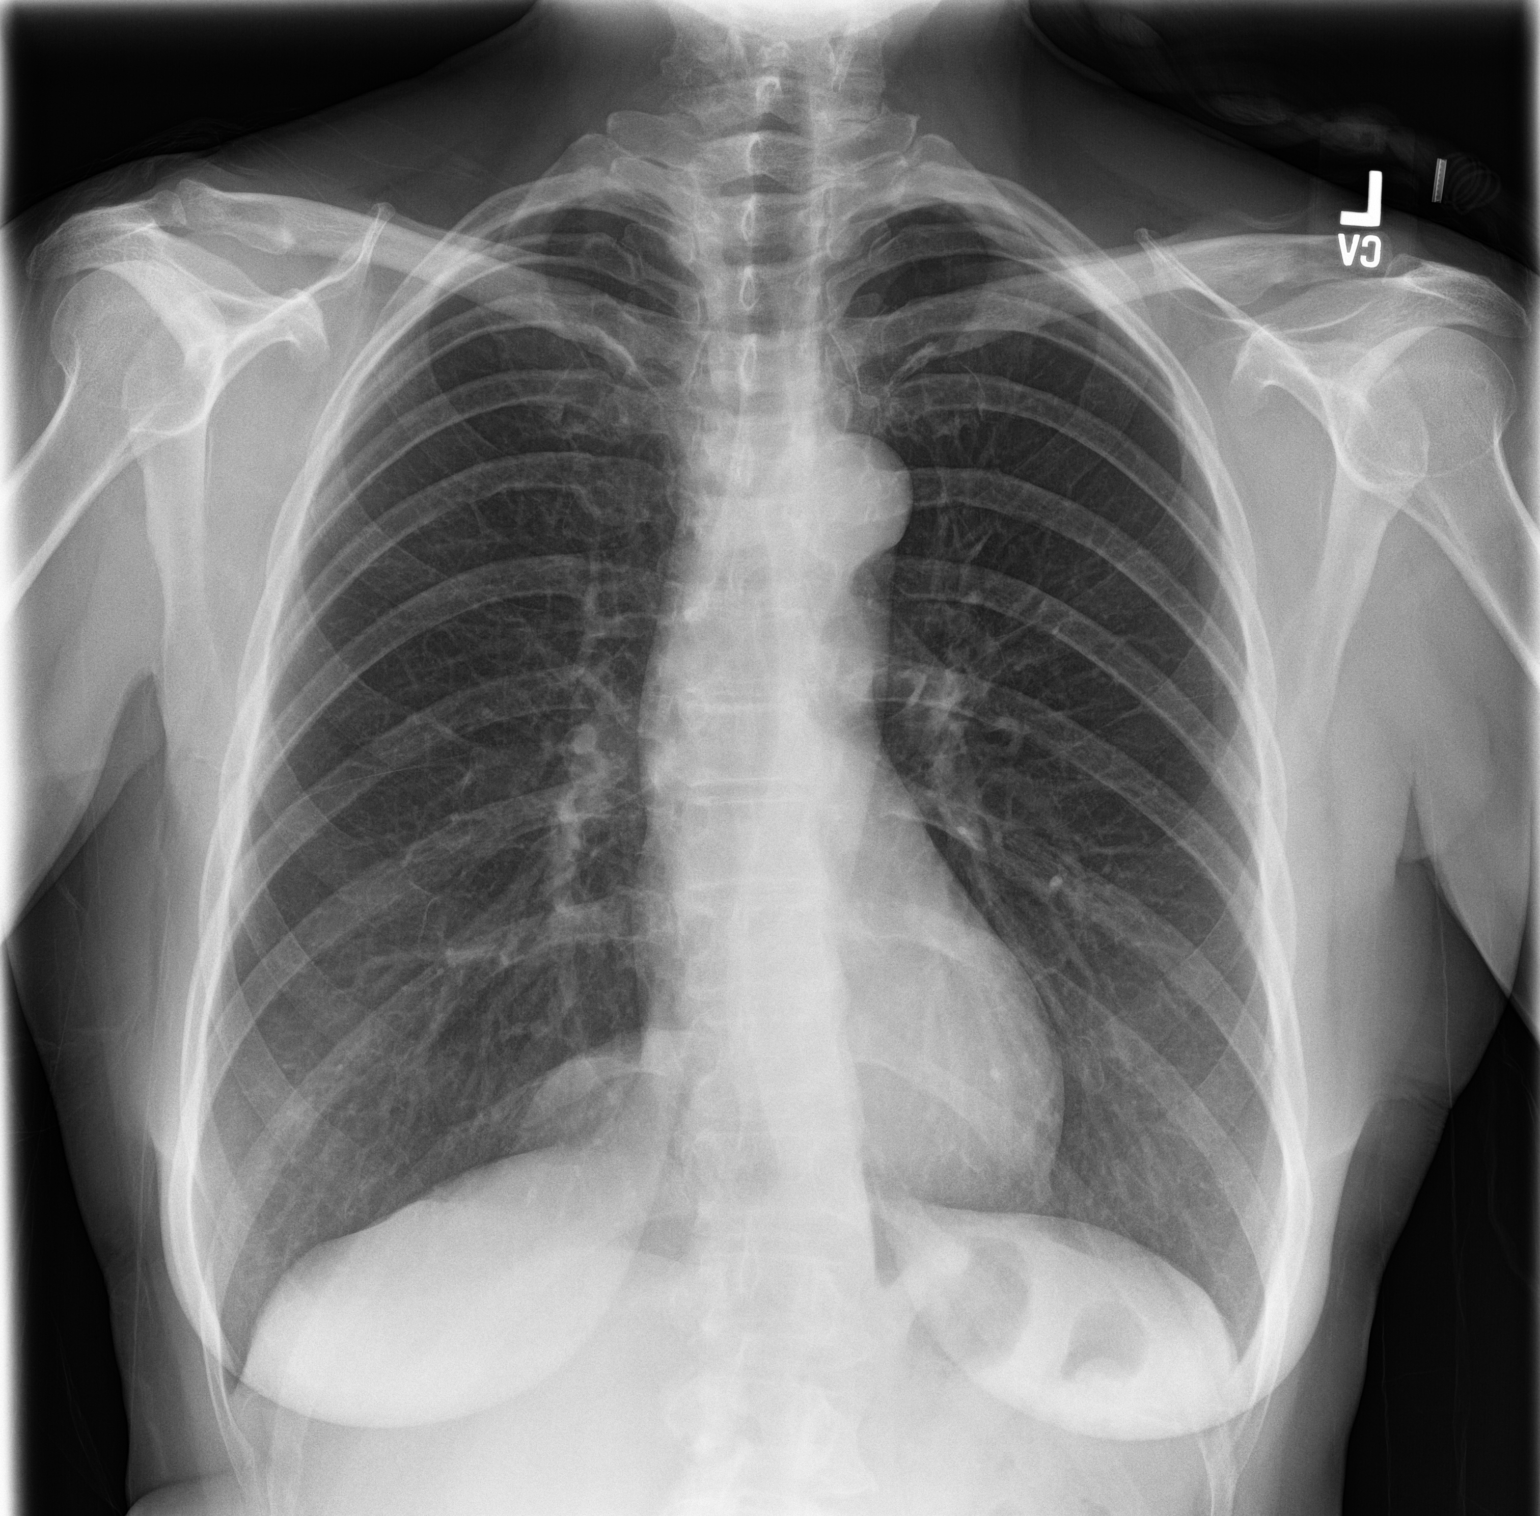
[im 2/2]
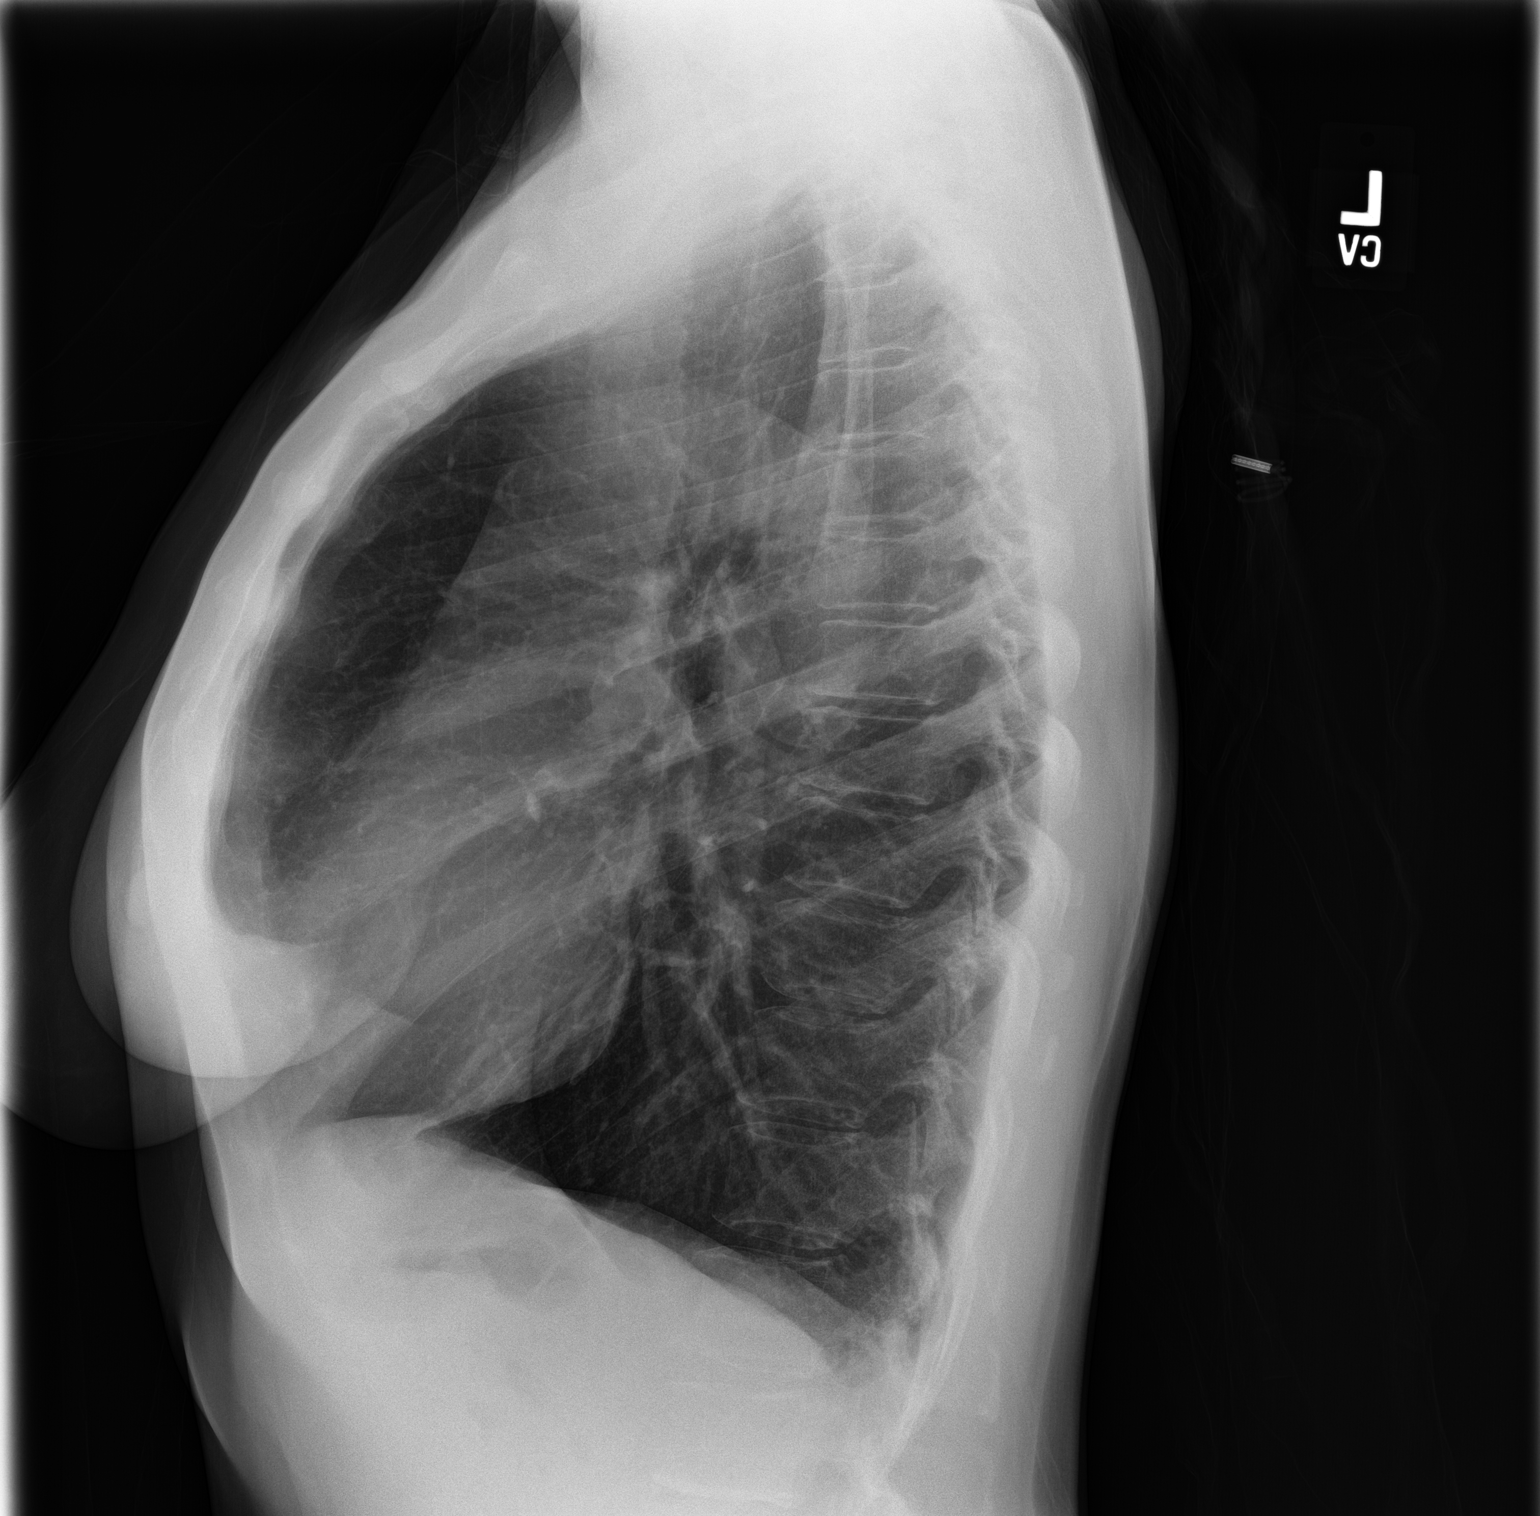

[2 of 2 positions shown; findings below may reference images not displayed]

FINDINGS: The lungs are hyperinflated likely secondary to COPD. There is no
focal parenchymal opacity. There is no pleural effusion or
pneumothorax. The heart and mediastinal contours are unremarkable.

The osseous structures are unremarkable.
IMPRESSION: No active cardiopulmonary disease.

## 2016-04-18 ENCOUNTER — Telehealth (INDEPENDENT_AMBULATORY_CARE_PROVIDER_SITE_OTHER): Payer: Self-pay | Admitting: Internal Medicine

## 2016-04-18 DIAGNOSIS — J45909 Unspecified asthma, uncomplicated: Secondary | ICD-10-CM

## 2016-04-18 MED ORDER — ALBUTEROL SULFATE HFA 108 (90 BASE) MCG/ACT IN AERS
2.0000 | INHALATION_SPRAY | RESPIRATORY_TRACT | 3 refills | Status: DC | PRN
Start: 2016-04-18 — End: 2021-11-29

## 2016-04-18 NOTE — Telephone Encounter (Signed)
(  TEXTING IS AN OPTION FOR [facility] ONLY)  Is this a [facility] clinic? Yes. What is the mobile number we can use to get a hold of you via text? [phone]      RETURN CALL: Detailed message on voicemail only      SUBJECT:  Refill Request    MEDICATION(S): Albuterol Sulfate HFA (VENTOLIN HFA) 108 (90 BASE) MCG/ACT Inhalation Aero   NEEDED BY: ASAP/Patient out of medication  PRESCRIBING PROVIDER: Rosalie DoctorFerrucci, Cynthia Grace, MD    [facility]ATION[id number] [address] [place] [id number]     PHARMACY PHONE: [phone]   PHARMACY FAX NUMBER: [phone]  ADDITIONAL INFORMATION: NA

## 2016-04-18 NOTE — Telephone Encounter (Signed)
LVM notifying patient rx was sent. Closing TE.

## 2016-04-18 NOTE — Telephone Encounter (Signed)
Patient is requesting an urgent refill for Ventolin.      Last Order: 03/12/13  Last OV: 05/29/15    Pharmacy and medication loaded, please approve as you see fit.

## 2016-07-06 ENCOUNTER — Other Ambulatory Visit (INDEPENDENT_AMBULATORY_CARE_PROVIDER_SITE_OTHER): Payer: Self-pay | Admitting: Internal Medicine

## 2016-07-06 DIAGNOSIS — N951 Menopausal and female climacteric states: Secondary | ICD-10-CM

## 2016-07-06 MED ORDER — MEDROXYPROGESTERONE ACETATE 2.5 MG OR TABS
2.5000 mg | ORAL_TABLET | Freq: Every day | ORAL | 0 refills | Status: DC
Start: 2016-07-06 — End: 2016-08-11

## 2016-07-06 NOTE — Telephone Encounter (Signed)
Patient last seen on 05/29/15 for follow up, medication management and no future appointments are scheduled.  One refill authorized.  Please schedule follow up visit.

## 2016-08-11 ENCOUNTER — Other Ambulatory Visit (INDEPENDENT_AMBULATORY_CARE_PROVIDER_SITE_OTHER): Payer: Self-pay | Admitting: Internal Medicine

## 2016-08-11 DIAGNOSIS — N951 Menopausal and female climacteric states: Secondary | ICD-10-CM

## 2016-08-11 NOTE — Telephone Encounter (Signed)
Patient is requesting more than one refill of MedroxyPROGESTERone Acetate 2.5 MG Oral Tab. If not authorized, we'll need to call her and tell her why.     Last OV: 05/29/15  Last refill: 07/06/16    Medication and pharmacy loaded, please sign if approved. Thanks!

## 2016-08-11 NOTE — Telephone Encounter (Signed)
(  TEXTING IS AN OPTION FOR [facility] ONLY)  Is this a [facility] clinic? Yes. Patient declined the option to receive mobile text messages.      RETURN CALL: Detailed message on voicemail only      SUBJECT:  Refill Request    MEDICATION(S): MedroxyPROGESTERone Acetate 2.5 MG Oral Tab  NEEDED BY: Urgently, patient has one left and will be going out of town tomorrow  PRESCRIBING PROVIDER: Dr. Festus AloeFerrucci  [facility] AND [facility]ATION[facility] [address] [place] [phone]    ADDITIONAL INFORMATION: Patient states that in the past, Dr. Festus AloeFerrucci has given her more than 1 refill but this time she only gave her 1 refill. She is wanting to know if she can get it authorized for more than just 1 refill this time and is wanting to know if there was a reason for not renewing it for more than 1 month. Please call her to let her know if Dr. Festus AloeFerrucci did not authorize it for more than 1 refill for a specific reason or not, thanks.

## 2016-08-14 MED ORDER — MEDROXYPROGESTERONE ACETATE 2.5 MG OR TABS
ORAL_TABLET | ORAL | 3 refills | Status: DC
Start: 2016-08-14 — End: 2021-11-29

## 2016-08-14 NOTE — Telephone Encounter (Signed)
Authorizing this medication renewal request is outside of the Refill Center's protocols. Last seen > 1 year.         07/06/16 RAC message routed to [facility] Front Desk Pool: Patient last seen on 05/29/15 for follow up, medication management and no future appointments are scheduled.  One refill authorized.  Please schedule follow up visit.    Also due for yearly mammogram, last found 07/02/15.    Please sign and close the encounter if you approve: MedroxyPROGESTERone Acetate 2.5 MG Oral Tab   TAKE ONE TABLET BY MOUTH ONCE DAILY, Disp-30 tablet, R-0, E-RX    If this medication is denied please have your staff inform the patient and schedule an appointment if necessary.

## 2016-08-14 NOTE — Telephone Encounter (Signed)
Please tell pt to schedule annual visit.

## 2016-08-14 NOTE — Telephone Encounter (Signed)
Routing to covering provider, patient needs this done urgently as she is leaving tomorrow.

## 2016-08-16 NOTE — Telephone Encounter (Signed)
Routing to FD to help schedule patient for an annual exam.     MedroxyPROGESTERone Acetate 2.5 MG Oral Tab was refilled with 3 refills, but patient is due for annual exam.

## 2016-08-16 NOTE — Telephone Encounter (Signed)
Patient scheduled.

## 2016-08-18 NOTE — Telephone Encounter (Signed)
Patient scheduled 10/25

## 2016-09-07 ENCOUNTER — Ambulatory Visit (HOSPITAL_BASED_OUTPATIENT_CLINIC_OR_DEPARTMENT_OTHER): Payer: PPO

## 2016-09-08 ENCOUNTER — Ambulatory Visit: Payer: PPO | Attending: Internal Medicine

## 2016-09-08 ENCOUNTER — Other Ambulatory Visit: Payer: Self-pay | Admitting: Internal Medicine

## 2016-09-08 DIAGNOSIS — Z1231 Encounter for screening mammogram for malignant neoplasm of breast: Secondary | ICD-10-CM | POA: Insufficient documentation

## 2016-09-20 ENCOUNTER — Encounter (INDEPENDENT_AMBULATORY_CARE_PROVIDER_SITE_OTHER): Payer: Self-pay | Admitting: Internal Medicine

## 2016-09-20 ENCOUNTER — Other Ambulatory Visit (HOSPITAL_BASED_OUTPATIENT_CLINIC_OR_DEPARTMENT_OTHER)
Admit: 2016-09-20 | Discharge: 2016-09-20 | Disposition: A | Discharge status: Home / Self Care | Payer: PPO | Attending: Internal Medicine | Admitting: Internal Medicine

## 2016-09-20 ENCOUNTER — Ambulatory Visit (INDEPENDENT_AMBULATORY_CARE_PROVIDER_SITE_OTHER): Payer: PPO | Admitting: Internal Medicine

## 2016-09-20 VITALS — BP 158/92 | HR 80 | Temp 96.5°F | Resp 16 | Wt 186.7 lb

## 2016-09-20 DIAGNOSIS — R030 Elevated blood-pressure reading, without diagnosis of hypertension: Secondary | ICD-10-CM

## 2016-09-20 DIAGNOSIS — N938 Other specified abnormal uterine and vaginal bleeding: Secondary | ICD-10-CM

## 2016-09-20 DIAGNOSIS — E7849 Other hyperlipidemia: Secondary | ICD-10-CM

## 2016-09-20 DIAGNOSIS — E784 Other hyperlipidemia: Secondary | ICD-10-CM

## 2016-09-20 DIAGNOSIS — Z6835 Body mass index (BMI) 35.0-35.9, adult: Secondary | ICD-10-CM

## 2016-09-20 DIAGNOSIS — N951 Menopausal and female climacteric states: Secondary | ICD-10-CM

## 2016-09-20 DIAGNOSIS — Z79890 Hormone replacement therapy (postmenopausal): Secondary | ICD-10-CM | POA: Insufficient documentation

## 2016-09-20 DIAGNOSIS — Z1151 Encounter for screening for human papillomavirus (HPV): Secondary | ICD-10-CM | POA: Insufficient documentation

## 2016-09-20 DIAGNOSIS — F4320 Adjustment disorder, unspecified: Secondary | ICD-10-CM

## 2016-09-20 DIAGNOSIS — H9193 Unspecified hearing loss, bilateral: Secondary | ICD-10-CM

## 2016-09-20 DIAGNOSIS — F4321 Adjustment disorder with depressed mood: Secondary | ICD-10-CM

## 2016-09-20 DIAGNOSIS — Z124 Encounter for screening for malignant neoplasm of cervix: Secondary | ICD-10-CM | POA: Insufficient documentation

## 2016-09-20 DIAGNOSIS — Z0000 Encounter for general adult medical examination without abnormal findings: Secondary | ICD-10-CM

## 2016-09-20 MED ORDER — ESTRADIOL 0.075 MG/24HR TD PTTW
MEDICATED_PATCH | TRANSDERMAL | 3 refills | Status: DC
Start: 2016-09-20 — End: 2021-11-29

## 2016-09-20 NOTE — Patient Instructions (Addendum)
Connie [name]             Phone [phone]             [address]             [address]             [place], [place] [id number].      Connie Poundsrystal Deloach, Connie [name]  Address: [address]  [place],  [place]  [id number]  Phone: [phone]      Connie [name]         [address]   [place], [place] [id number]    [phone]        Please check your blood pressure 2 x per week for then next few week. Please see me if your blood pressure is mostly over 140/90.

## 2016-09-20 NOTE — Progress Notes (Signed)
Reason for visit:   Chief Complaint   Patient presents with   . Annual Exam     physical with pap, at tail end of period        Cervical screening/PAP: NO  Mammo: YES:  [facility]  Colon Screen: NO  Have you seen a specialist since your last visit: NO     If yes, Name and location and date.:      HM Due:   Health Maintenance   Topic Date Due   . Diabetes Screen  01/27/2012   . Pap Smear  02/23/2014   . Influenza Vaccine (1) 08/27/2016   . Mammogram  03/09/2017   . Cholesterol Test  03/04/2018   . Colonoscopy  04/11/2021   . Tetanus Vaccine  07/31/2021   . Hepatitis C Screen  Completed   . HIV Screen  Completed              No future appointments.

## 2016-09-21 ENCOUNTER — Encounter (INDEPENDENT_AMBULATORY_CARE_PROVIDER_SITE_OTHER): Payer: Self-pay | Admitting: Internal Medicine

## 2016-09-21 NOTE — Progress Notes (Signed)
Connie [name] is a 55 year old female here for an annual health maintenance exam.     PREVIOUSLY ENTERED HISTORICAL INFORMATION:    Patient Active Problem List    Diagnosis Date Noted   . Asthma [J45.909] 09/23/2004   . Allergic rhinitis, cause unspecified [J30.9] 05/24/2004   . General counseling and advice for procreative management [Z31.69] 09/16/2000   . Spontaneous abortion [O03.9]    . Hormone replacement therapy (HRT) [Z79.890] 09/20/2016   . Other hyperlipidemia [E78.4] 09/20/2016   . 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 Sleep apnea [G47.30] 05/05/2013     AHI 6 on OCST.  Frequent central apneas.     . Insomnia [G47.00] 03/04/2013       Past Medical History:   Diagnosis Date   . No significant past medical history        Past Surgical History:   Procedure Laterality Date   . NO PRIOR SURGERIES         Obstetric History    G7   P1   T1   P0   A3   L1     SAB0   TAB0   Ectopic0   Multiple0   Live Births3    Obstetric Comments   had a w/u for her SAbs; had a successful pregnancy thereafter while on progest       Family History     Problem (# of Occurrences) Relation (Name,Age of Onset)    Heart Disease (1) Father    Lipids (1) Mother    Other (1) Father: Crohns          Current Outpatient Prescriptions   Medication Sig Dispense Refill   . Albuterol Sulfate HFA (VENTOLIN HFA) 108 (90 Base) MCG/ACT Inhalation Aero Soln Inhale 2 puffs by mouth every 4 hours as needed. 1 Inhaler 3   . Desonide 0.05 % External Ointment Apply 1 application topically 2 times a day as needed (for psoriasis). Location to apply: abdomen 15 g 2   . Estradiol 0.075 MG/24HR Transdermal PATCH BIWEEKLY APPLY 1 PATCH TO SKIN TWICE A WEEK 24 patch 3   . MedroxyPROGESTERone Acetate 2.5 MG Oral Tab TAKE ONE TABLET BY MOUTH ONCE DAILY 30 tablet 3     No current facility-administered medications for this visit.         Social History     Social History   . Marital status: Married     Spouse name: N/A   . Number of children: N/A   . Years  of education: N/A     Occupational History   . Not on file.     Social History Main Topics   . Smoking status: Former Smoker     Packs/day: 0.50     Years: 10.00     Types: Cigarettes     Quit date: 08/27/1989   . Smokeless tobacco: Never Used   . Alcohol use Yes      Comment: 1-2 every coulpe of weeks   . Drug use: No   . Sexual activity: Yes     Partners: Male      Comment: mutually mongamous since 45     Other Topics Concern   . Special Diet Yes     stress eating   . Exercise No     likes to walk     Social History Narrative    Mom for 1 child, husband is a Scientist, clinical (histocompatibility and immunogenetics).  Has lived in [place] since 1993.  Pt used to do glass work         09/20/2016    She had her husband lost their 25 you daughter to an overdose in April 2017. Her father passed at an advanced age last week. She has been working in grief counseling. She is interested in more general ongoing counseling.                   PHYSICAL EXAM:    General: healthy, alert, no distress  Skin: Skin color, texture, turgor normal. No rashes or concerning lesions  Head: Normocephalic. No masses, lesions, tenderness or abnormalities  Eyes: Lids/periorbital skin normal, Conjunctivae/corneas clear, PERRL, EOM's intact  Ears: External ears normal. Canals clear. TM's normal.  Nose:normal  Oropharynx: Lips, mucosa, and tongue normal. Teeth and gums normal., posterior pharynx without erythema or drainage  Neck: supple. No adenopathy. Thyroid symmetric, normal size, without nodules  Lungs: clear to auscultation  Heart: normal rate, regular rhythm and no murmurs, clicks, or gallops  Breasts: No obvious deformity or mass to inspection, nipples everted bilaterally, no skin lesion or nipple discharge, no mass palpated, no axillary lymphadenopathy  Abd: soft, non-tender. BS normal. No masses or organomegaly  GU: normal bartholin/skene/urethral meatus/anus., Vagina is rugated and well-estrogenized, cervix normal in appearance, no CMT, no bladder tenderness, uterus  normal size, shape, and consistency, no adnexal masses or tenderness  Rectal: deferred  Ext: Normal, without deformities, edema, or skin discoloration  Neuro: grossly normal       ASSESSMENT AND PLAN:    1. Routine screening:      (Z00.00) Wellness examination  (primary encounter diagnosis)  Plan: PAP SMEAR (OPTIONAL HPV), COMPREHENSIVE         METABOLIC PANEL, LIPID PANEL          2. Immunizations today:         Immunization History   Administered Date(s) Administered   . Influenza 09/25/2005   . MMR (measles mumps rubella) live vaccine 08/22/2002   . Td 12/31/2001   . Tdap vaccine 08/01/2011   . hepatitis A vaccine 08/22/2002, 08/01/2006   . pandemic influenza vaccine 10/30/2008   . poliomyelitis (Ipol) IPV inactive vaccine 08/22/2002   . typhoid Vi polysaccharide vaccine 08/01/2006   . yellow fever live vaccine 08/01/2006     3. Preventive counseling: proper exercise,        ==========================================================================  The following represents signficant issues addressed separately at this visit.        (Z79.890) Hormone replacement therapy (HRT)  (N93.8) DUB (dysfunctional uterine bleeding)  She requires a refill of medications.  She continues to have period although very irregualar.  She denies sexually transmitted infection risk.      (E78.4) Other hyperlipidemia  She needs labs checked.    (F43.20) Grief  She had her husband lost their 89 you daughter to an overdose in April 2017. Her father passed at an advanced age last week. She has been working in grief counseling. She is interested in more general ongoing counseling. She is still struggling. She has had stress eating and weight gain.    (R03.0) Elevated blood pressure reading  Noted today.    (H91.93) Bilateral hearing loss, unspecified hearing loss type    she feels that her hearing loss has worsened.    PE:  BP (!) 158/92   Pulse 80   Temp 96.5 F (35.8 C) (Temporal)   Resp 16   Wt 186 lb 11.7 oz (84.7 kg)   LMP  09/16/2016 (Exact Date)   SpO2 97%   BMI 35.28 kg/m    Wd/wn female, teary at times/  Heart reveals S1 and S2 normal, no murmurs, clicks, gallops or rubs.  Regular rate and  rhythm.  No edema or JVD.   Chest is clear; no wheezes or rales.  Pelvic: external genitalia normal.  vaginal vault normal.  cervix normal and without ectopy, friablity  or discharge. Bimanual exam without masses or tenderness.  External ears  and canals clear bilaterally. TM's normal bilaterally. Nose normal without lesions or discharge. Oropharynx normal. Neck supple without palpable adenopathy.'    A/P      (E78.4) Other hyperlipidemia  Plan: COMPREHENSIVE METABOLIC PANEL, LIPID PANEL            (N95.1) Hot flash, menopausal  Plan: Estradiol 0.075 MG/24HR Transdermal PATCH         BIWEEKLY            (F43.20) Grief  Plan: we discussed options for counseling.     (R03.0) Elevated blood pressure reading  Plan: See patient instructions followup one month    (N93.8) DUB (dysfunctional uterine bleeding)  Plan: US PELVIS COMPLETE, CBC, DIFF, THYROID         STIMULATING HORMONE, CANCELED: CBC, DIFF,         CANCELED: THYROID STIMULATING HORMONE            (H91.93) Bilateral hearing loss, unspecified hearing loss type  Plan: REFERRAL TO AUDIOLOGY

## 2016-09-22 ENCOUNTER — Encounter (INDEPENDENT_AMBULATORY_CARE_PROVIDER_SITE_OTHER): Payer: Self-pay | Admitting: Internal Medicine

## 2016-09-22 LAB — CERVICAL CANCER SCREENING
Cytologic Impression: NEGATIVE
LAB AP HISTORIC HPV PRESENCE: NEGATIVE

## 2016-09-22 LAB — HPV ONLY
Cytologic Impression: NEGATIVE
HPV Presence: NEGATIVE

## 2016-09-26 ENCOUNTER — Other Ambulatory Visit (INDEPENDENT_AMBULATORY_CARE_PROVIDER_SITE_OTHER): Payer: PPO

## 2016-10-06 ENCOUNTER — Encounter (INDEPENDENT_AMBULATORY_CARE_PROVIDER_SITE_OTHER): Payer: Self-pay

## 2016-10-13 ENCOUNTER — Encounter (HOSPITAL_BASED_OUTPATIENT_CLINIC_OR_DEPARTMENT_OTHER): Payer: PPO | Admitting: Audiologist

## 2016-10-17 ENCOUNTER — Ambulatory Visit (HOSPITAL_BASED_OUTPATIENT_CLINIC_OR_DEPARTMENT_OTHER): Payer: PPO | Attending: Audiologist | Admitting: Audiologist

## 2016-10-17 DIAGNOSIS — H903 Sensorineural hearing loss, bilateral: Secondary | ICD-10-CM

## 2016-10-17 NOTE — Progress Notes (Signed)
AUDIOLOGY - CLINIC NOTE     SUBJECTIVE   Connie [name], a 55 y/o woman, was seen in clinic for a comprehensive hearing evaluation. This patient is referred by Dr. Rosalie Doctorynthia Grace Ferrucci of [facility]. Primary concern is progressive hearing loss.    The patient was [facility], at which time the audiogram showed hearing WNL sloping to moderate sensorineural hearing loss bilaterally. The patient believes her hearing may be getting worse over time. She reports that she often needs people to repeat themselves, and that she cannot hear people talking from another room. She reports a family history of age-related hearing loss that includes both parents.    Otologic history was otherwise unremarkable. Currently this patient is not experiencing any ear pain, pressure, fullness, or discharge. There is no history of: ear surgeries, significant head injuries, recent ear infections, or hazardous noise exposure. No tinnitus or dizziness noted.       OBJECTIVE   Otoscopy revealed both ears to be free of excessive cerumen or debris and both tympanic membranes to be intact.      Tympanometry was within normal limits bilaterally, and indicated proper function of the middle ear system.     Pure tone audiometry showed hearing WNL sloping to a moderate sensorineural hearing loss bilaterally.     PTA was 25 dBHL in the right ear, and 25 dBHL in the left ear.    SRT was 25 dBHL in the right ear, and 25 dBHL in the left ear.    WRS were 96% at 65 dBHL in the right ear, and 96% at 65 dBHL in the left ear.    Please see the audiogram under the "media" tab in Epic for details.     ASSESSMENT   Connie [name], a 55 y/o woman, was seen in clinic for a comprehensive hearing evaluation. The hearing test shows hearing WNL sloping to a moderate sensorineural hearing loss bilaterally. Word discrimination is excellent bilaterally.    As compared to the last hearing test, today's results show  that thresholds are essentially stable.    These results were shared with the patient, and she was given a copy for her records. Discussed possible use of hearing aids, as the patient reports difficulty with hearing in her daily activities. The patient would like to check for any hearing aid benefits with her insurance, then call to schedule a hearing aid evaluation (HAE) as desired. Dispensed information about the [facility] and other funding options. Recommend annual audiogram to monitor any changes in hearing.    PLAN   1) Patient plans to check for hearing aid benefits with her insurance, then schedule HAE with [facility] if desired.  2) F/U in 1 year or sooner as needed for annual review to r/o progression of hearing loss.

## 2016-10-21 ENCOUNTER — Encounter (INDEPENDENT_AMBULATORY_CARE_PROVIDER_SITE_OTHER): Payer: Self-pay

## 2016-11-01 ENCOUNTER — Other Ambulatory Visit: Payer: Self-pay | Admitting: Internal Medicine

## 2016-11-01 DIAGNOSIS — L409 Psoriasis, unspecified: Secondary | ICD-10-CM

## 2016-11-01 NOTE — Telephone Encounter (Signed)
No previous fill history provided by requesting pharmacy.

## 2016-11-02 MED ORDER — DESONIDE 0.05 % EX OINT
1.0000 | TOPICAL_OINTMENT | Freq: Two times a day (BID) | CUTANEOUS | 1 refills | Status: AC | PRN
Start: 2016-11-02 — End: ?

## 2016-11-02 NOTE — Telephone Encounter (Signed)
The requested medication requires your authorization:    Desonide oint - last prescribed 02/24/14 for 15gm+2RFs. Ok to refill?

## 2016-11-22 ENCOUNTER — Other Ambulatory Visit: Payer: Self-pay | Admitting: Internal Medicine

## 2016-11-22 ENCOUNTER — Ambulatory Visit: Payer: PPO | Attending: Internal Medicine

## 2016-11-22 ENCOUNTER — Telehealth (INDEPENDENT_AMBULATORY_CARE_PROVIDER_SITE_OTHER): Payer: Self-pay | Admitting: Internal Medicine

## 2016-11-22 DIAGNOSIS — N938 Other specified abnormal uterine and vaginal bleeding: Secondary | ICD-10-CM

## 2016-11-22 NOTE — Telephone Encounter (Signed)
Patient would like a call back once lab results are in with interpretation.   We sent information to patient to assist with getting set up on [facility]. She would also like to receive the results from these labs through e[facility].     Please advise, thanks!  OK to leave detailed voicemail messages.

## 2016-11-22 NOTE — Telephone Encounter (Signed)
Tc to patient. We reviewed pelvic us. Advised GYN consult. Ordered.

## 2016-11-27 ENCOUNTER — Other Ambulatory Visit: Payer: Self-pay

## 2017-09-25 ENCOUNTER — Other Ambulatory Visit: Payer: Self-pay

## 2018-07-11 ENCOUNTER — Other Ambulatory Visit (HOSPITAL_BASED_OUTPATIENT_CLINIC_OR_DEPARTMENT_OTHER): Payer: Self-pay

## 2019-03-25 NOTE — Progress Notes (Signed)
Department: Population Health Management  Insurer: Radio broadcast assistant purpose:     Care Gaps due: Breast cancer screening     No patient outreach needed based on thorough chart review. Chart Review indicates PT has been receiving care at [facility], has no pcp with [facility] on file.  Documenting PT info for department purposes and routing to appropriate management to notify that the PT has confirmed not [facility].    Closing Encounter.

## 2020-03-02 ENCOUNTER — Ambulatory Visit (HOSPITAL_BASED_OUTPATIENT_CLINIC_OR_DEPARTMENT_OTHER): Payer: PPO

## 2021-11-01 ENCOUNTER — Telehealth (INDEPENDENT_AMBULATORY_CARE_PROVIDER_SITE_OTHER): Payer: Self-pay | Admitting: Family Medicine

## 2021-11-01 NOTE — Telephone Encounter (Signed)
Patient is requesting to establish care with me. She is the wife of one of my patients so I am able to take care, as I am open to my patients family members.  Please reach out to her to make an appointment to establish care/annual.  Thank you!  Darral Dash

## 2021-11-02 NOTE — Telephone Encounter (Signed)
Scheduled pt for 01/23   Closing TE

## 2021-11-28 NOTE — Progress Notes (Signed)
Chief Complaint   Patient presents with    Establish Care    Wellness    Refill Request     albuterol       Connie [name] is a 61 year old female with PMH significant for   Patient Active Problem List   Diagnosis    General counseling and advice for procreative management    Spontaneous abortion    Allergic rhinitis, cause unspecified    Asthma    Insomnia    Sleep apnea    Chondromalacia    Hormone replacement therapy (HRT)    Other hyperlipidemia    Annual physical exam    Menopause    Atrophic vaginitis    HTN (hypertension), benign    FH: heart disease    Flat feet    Bunion    Myalgia    Obesity, Class II, BMI 35-39.9    Chronic cough     here today for a preventive health visit.     An interpreter was not needed for the visit.  Other problems or concerns today:       HYPERTENSION:  Has been stable on the medication.  Takes HCTZ and Losartan.  Not sure about her dose.  Feeling well.    -FH CVD:  Brother started young in his early 63's.  Worries about having heart issues.  Would like to have lab tests done    -Covid 19  In December 2022  Has been coughing and has had a bit of a wheeze.  H/o allergies in the summer and will cause her to cough.  Due to have mammogram,  Due to DXA.  Due for colonoscopy.    -Muscle spasms:  Stiff and sore muscles.  Restrictions of movement.  With PHYSICAL THERAPY it does help for a while.  She was not able to keep up the routine.  Started yoga this fall and it is helpful.    -Obesity:  Weight has been an issue since menopause  antiinflammatory diet is very good for her  but when stopping, symptoms of pain come back.  Has been hard to find motivation    -Menopause:  She has been using HRT in the past.  Lately using vaginal tablets and it was better with intercourse but the pain is coming back.  She was about 61 yo when lost period.  Has not bled since  Last pap in the fall of 2021  Normal. unsure about HPV status    -Pain on feet:  Has hyperpronation.  During pregnancy  became flat.  Needs orthotics.  Also bunions are painful.  Would like referral to podiatry,    Current Outpatient Medications   Medication Instructions    Albuterol Sulfate HFA (VENTOLIN HFA) 108 (90 Base) MCG/ACT Inhalation Aero Soln 2 puffs, Inhalation, Every 4 hours PRN    Desonide 0.05 % External Ointment 1 application, Topical, 2 times daily PRN, Location to apply: abdomen       PHQ2 Total Score: 0      LIFESTYLE  Social History     Socioeconomic History    Marital status: Married     Spouse name: Not on file    Number of children: Not on file    Years of education: Not on file    Highest education level: Not on file   Occupational History    Not on file   Tobacco Use    Smoking status: Former     Packs/day: 0.50  Years: 10.00     Pack years: 5.00     Types: Cigarettes     Quit date: 08/27/1989     Years since quitting: 32.2    Smokeless tobacco: Never   Substance and Sexual Activity    Alcohol use: Yes     Comment: 1-2 every coulpe of weeks    Drug use: No    Sexual activity: Yes     Partners: Male     Comment: mutually mongamous since 1987   Other Topics Concern    Blood Transfusions Not Asked    Occupational Exposure Not Asked    Hobby Hazards Not Asked    Sleep Concern Not Asked    Stress Concern Not Asked    Weight Concern Not Asked    Special Diet Yes     Comment: stress eating    Back Care Not Asked    Exercise No     Comment: likes to walk    Bike Helmet Not Asked    Self-Exams Not Asked    Falls in past year Not Asked    Preferred learning style Not Asked    Personal/religious/cultural values that affect care Not Asked    Feels safe at home Not Asked    Military Service Not Asked    Caffeine Concern Not Asked    Seat Belt Not Asked   Social History Narrative    Mom for 1 child, husband is a Administrator.  Has lived in [place] since 1993.     Pt used to do glass work         09/20/2016    She had her husband lost their 66 you daughter to an overdose in April 2017. Her  father passed at an advanced age last week. She has been working in grief counseling. She is interested in more general ongoing counseling.         Social Determinants of Health     Financial Resource Strain: Not on file   Food Insecurity: Not on file   Transportation Needs: Not on file   Physical Activity: Not on file   Stress: Not on file   Social Connections: Not on file   Intimate Partner Violence: Not on file   Housing Stability: Not on file     Dietary habits: healthy diet in general  Needs to cut back on sweets, Calcium: dairy, cheese, yogurt  leafy greens  Exercise habits: yoga  Regular dental exams: yes      GYN HISTORY  OB History   Gravida Para Term Preterm AB Living   7 1 1  0 3 1   SAB IAB Ectopic Multiple Live Births   3 0 0 0 3   Obstetric Comments   had a w/u for her SAbs; had a successful pregnancy thereafter while on progest      Currently having periods: NO-no period for 10 years  Last pap: 2021   CANCER SCREENING  Family history of colon cancer: NO  Family history of uterine or ovarian cancer: NO  Family history of breast cancer: NO  [facility]  Family History     Problem (# of Occurrences) Relation (Name,Age of Onset)    Heart Disease (2) Father, Brother    Lipids (1) Mother    Other (1) Father: Crohns        Current Outpatient Medications   Medication Instructions    Albuterol Sulfate HFA (VENTOLIN HFA) 108 (90 Base) MCG/ACT Inhalation Aero  Soln 2 puffs, Inhalation, Every 4 hours PRN    Desonide 0.05 % External Ointment 1 application, Topical, 2 times daily PRN, Location to apply: abdomen    Estradiol 0.075 MG/24HR Transdermal PATCH BIWEEKLY APPLY 1 PATCH TO SKIN TWICE A WEEK    MedroxyPROGESTERone Acetate 2.5 MG Oral Tab TAKE ONE TABLET BY MOUTH ONCE DAILY       REVIEW OF SYSTEMS  Review of Systems   Constitutional: Positive for fatigue.   HENT: Negative for ear pain, hearing loss and sore throat.    Eyes: Negative for pain and visual disturbance.   Respiratory:  Negative for chest tightness and shortness of breath.    Cardiovascular: Negative for chest pain and palpitations.   Gastrointestinal: Negative for abdominal pain, blood in stool, constipation, diarrhea and rectal pain.   Endocrine: Negative.    Genitourinary: Positive for dyspareunia and vaginal pain. Negative for dysuria, flank pain and vaginal bleeding.   Musculoskeletal: Negative for arthralgias.   Skin: Negative.    Allergic/Immunologic: Negative.    Neurological: Negative for dizziness, seizures and headaches.   Hematological: Negative.    Psychiatric/Behavioral: Negative for sleep disturbance. The patient is not nervous/anxious.          BP 121/84    Pulse 84    Temp 36.9 C (Temporal)    Resp 14    Ht 5\' 1"  (1.549 m)    Wt 84.6 kg (186 lb 6.4 oz) Comment: with shoes on   LMP 09/16/2016 (Exact Date)    SpO2 98%    BMI 35.22 kg/m   Physical Exam  Vitals reviewed.   Constitutional:       General: She is not in acute distress.     Appearance: Normal appearance.   HENT:      Head: Normocephalic and atraumatic.      Right Ear: Tympanic membrane, ear canal and external ear normal. There is no impacted cerumen.      Left Ear: Tympanic membrane, ear canal and external ear normal. There is no impacted cerumen.      Nose: No congestion.      Mouth/Throat:      Mouth: Mucous membranes are moist.      Pharynx: Oropharynx is clear.   Eyes:      General:         Right eye: No discharge.         Left eye: No discharge.      Extraocular Movements: Extraocular movements intact.      Conjunctiva/sclera: Conjunctivae normal.      Pupils: Pupils are equal, round, and reactive to light.   Cardiovascular:      Rate and Rhythm: Normal rate and regular rhythm.      Heart sounds: No murmur heard.  Pulmonary:      Effort: Pulmonary effort is normal.      Breath sounds: Normal breath sounds. No wheezing.   Chest:      Comments: deferred  Abdominal:      General: Bowel sounds are normal. There is no distension.      Palpations: Abdomen  is soft. There is no mass.      Tenderness: There is no abdominal tenderness. There is no right CVA tenderness, left CVA tenderness, guarding or rebound.      Hernia: No hernia is present.   Musculoskeletal:         General: No tenderness. Normal range of motion.      Cervical back: Normal range of  motion.   Lymphadenopathy:      Cervical: No cervical adenopathy.   Skin:     Findings: No lesion or rash.   Neurological:      General: No focal deficit present.      Mental Status: She is alert and oriented to person, place, and time.   Psychiatric:         Mood and Affect: Mood and affect normal.         Behavior: Behavior normal.         Thought Content: Thought content normal.         Cognition and Memory: Memory normal.         Judgment: Judgment normal.            ASSESSMENT/PLAN:     [facility] was seen today for establish care, wellness and refill request.    Annual physical exam  -     CBC with Diff; Future  -     Comprehensive Metabolic Panel; Future  -     Hemoglobin A1C, HPLC; Future  -     Hepatitis C Antibody w/Reflex PCR; Future  -     HIV Antigen and Antibody Screen; Future  -     Lipid Panel; Future  -     TSH w/Reflexive Free T4; Future  -     Vitamin D 25-OH COMMON Deficiency Level; Future  -     Mammography Screening w/Tomo Bilateral; Future  -     Colonoscopy - Asymptomatic Screening; Future    Menopause  -     Dexa, Axial Skeleton; Future    Atrophic vaginitis  Assessment & Plan:  Will follow up if not better with medication.  Offered pelvic exam for possible yeast inf but patient denies currently symptoms    Orders:  -     lidocaine viscous 2 % mouth/throat solution; Take 5-10 mL by mouth daily as needed (pain with intercourse).  Dispense: 100 mL; Refill: 0  -     estradiol (Estring) 2 MG vaginal ring; Place 1 ring (2 mg) into the vagina every 90 days. Insert as deeply as possible into the upper one-third of the vagina. Replace every 90 days.  Dispense: 3 ring; Refill: 3    HTN (hypertension),  benign  Assessment & Plan:  Patient will message with the dose of her medications      FH: heart disease  -     Apolipoprotein B; Future  -     C-Reactive Protein w/Cardiac Risk; Future    Flat feet  -     Referral to Podiatry; Future    Bunion  -     Referral to Podiatry; Future    Myalgia  -     Referral to Acupuncture; Future  -     Referral to Massage Therapy    Chronic cough  Assessment & Plan:  Suspecting cough variant.  Offered vaccination for pneumonia but patient would like to postpone as she is travelling to [place]    Orders:  -     albuterol HFA (Ventolin HFA) 108 (90 Base) MCG/ACT inhaler; Inhale 2 puffs by mouth every 4 hours as needed for shortness of breath/wheezing.  Dispense: 18 g; Refill: 0    Obesity, Class II, BMI 35-39.9  Assessment & Plan:  Will discuss management once labs are back      Hormone replacement therapy (HRT)  Assessment & Plan:  Trial with Estring and follow up if symptoms persist.  Other orders  -     ZEBRA LABELS  -     ZEBRA LABELS        Follow-up: 1 year    Elsie SaasEsther Adon Gehlhausen, MD  [facility]  [facility]

## 2021-11-29 ENCOUNTER — Ambulatory Visit (INDEPENDENT_AMBULATORY_CARE_PROVIDER_SITE_OTHER): Payer: PPO | Admitting: Family Medicine

## 2021-11-29 ENCOUNTER — Other Ambulatory Visit (HOSPITAL_BASED_OUTPATIENT_CLINIC_OR_DEPARTMENT_OTHER): Payer: Self-pay

## 2021-11-29 ENCOUNTER — Encounter (INDEPENDENT_AMBULATORY_CARE_PROVIDER_SITE_OTHER): Payer: Self-pay | Admitting: Family Medicine

## 2021-11-29 VITALS — BP 121/84 | HR 84 | Temp 98.5°F | Resp 14 | Ht 61.0 in | Wt 186.4 lb

## 2021-11-29 DIAGNOSIS — I10 Essential (primary) hypertension: Secondary | ICD-10-CM

## 2021-11-29 DIAGNOSIS — Z0000 Encounter for general adult medical examination without abnormal findings: Secondary | ICD-10-CM | POA: Insufficient documentation

## 2021-11-29 DIAGNOSIS — M2141 Flat foot [pes planus] (acquired), right foot: Secondary | ICD-10-CM

## 2021-11-29 DIAGNOSIS — N952 Postmenopausal atrophic vaginitis: Secondary | ICD-10-CM

## 2021-11-29 DIAGNOSIS — E669 Obesity, unspecified: Secondary | ICD-10-CM

## 2021-11-29 DIAGNOSIS — Z8249 Family history of ischemic heart disease and other diseases of the circulatory system: Secondary | ICD-10-CM

## 2021-11-29 DIAGNOSIS — Z780 Asymptomatic menopausal state: Secondary | ICD-10-CM | POA: Insufficient documentation

## 2021-11-29 DIAGNOSIS — M2142 Flat foot [pes planus] (acquired), left foot: Secondary | ICD-10-CM

## 2021-11-29 DIAGNOSIS — M791 Myalgia, unspecified site: Secondary | ICD-10-CM

## 2021-11-29 DIAGNOSIS — Z79890 Hormone replacement therapy (postmenopausal): Secondary | ICD-10-CM

## 2021-11-29 DIAGNOSIS — M21619 Bunion of unspecified foot: Secondary | ICD-10-CM

## 2021-11-29 DIAGNOSIS — R053 Chronic cough: Secondary | ICD-10-CM

## 2021-11-29 MED ORDER — ESTRING 2 MG VA RING
2.0000 mg | VAGINAL_RING | VAGINAL | 3 refills | Status: DC
Start: 2021-11-29 — End: 2021-11-29

## 2021-11-29 MED ORDER — LIDOCAINE VISCOUS HCL 2 % MT SOLN
5.0000 mL | Freq: Every day | OROMUCOSAL | 0 refills | Status: DC | PRN
Start: 2021-11-29 — End: 2023-04-28

## 2021-11-29 MED ORDER — ALBUTEROL SULFATE HFA 108 (90 BASE) MCG/ACT IN AERS
2.0000 | INHALATION_SPRAY | RESPIRATORY_TRACT | 0 refills | Status: DC | PRN
Start: 2021-11-29 — End: 2022-04-14

## 2021-11-29 MED ORDER — ESTRING 2 MG VA RING
2.0000 mg | VAGINAL_RING | VAGINAL | 3 refills | Status: DC
Start: 2021-11-29 — End: 2022-01-31
  Filled 2021-11-29: qty 1, 90d supply, fill #0

## 2021-11-29 NOTE — Assessment & Plan Note (Signed)
Will discuss management once labs are back

## 2021-11-29 NOTE — Assessment & Plan Note (Signed)
Suspecting cough variant.  Offered vaccination for pneumonia but patient would like to postpone as she is travelling to [place]

## 2021-11-29 NOTE — Assessment & Plan Note (Signed)
Will follow up if not better with medication.  Offered pelvic exam for possible yeast inf but patient denies currently symptoms

## 2021-11-29 NOTE — Assessment & Plan Note (Signed)
Patient will message with the dose of her medications

## 2021-11-29 NOTE — Assessment & Plan Note (Addendum)
Trial with Estring and follow up if symptoms persist.

## 2021-11-29 NOTE — Patient Instructions (Signed)
Dear Connie [name],    It was a pleasure to see you today. Thank you for choosing  Medicine.    If medications were prescribed, please read the information your pharmacist will provide you about the medication, its indications and potential side effects.  Please ask any questions you may have before taking the medication.      If labs were ordered today, the results will be delivered to your mychart account followed by a message explaining the interpretation a few days after.    Today we dicussed:  1. Annual physical exam  Please see below for other recommendations to keep a healthy life style.  - CBC with Diff; Future  - Comprehensive Metabolic Panel; Future  - Hemoglobin A1C, HPLC; Future  - Hepatitis C Antibody w/Reflex PCR; Future  - HIV Antigen and Antibody Screen; Future  - Lipid Panel; Future  - TSH w/Reflexive Free T4; Future  - Vitamin D 25-OH COMMON Deficiency Level; Future  - Mammography Screening w/Tomo Bilateral; Future  - Colonoscopy - Asymptomatic Screening; Future    2. Menopause     - Dexa, Axial Skeleton; Future    3. Atrophic vaginitis     - lidocaine viscous 2 % mouth/throat solution; Take 5-10 mL by mouth daily as needed (pain with intercourse).  Dispense: 100 mL; Refill: 0  - estradiol (Estring) 2 MG vaginal ring; Place 1 ring (2 mg) into the vagina every 90 days. Insert as deeply as possible into the upper one-third of the vagina. Replace every 90 days.  Dispense: 3 ring; Refill: 3    4. HTN (hypertension), benign  Please send me a message with the dose of your medications for blood pressure    5. FH: heart disease     - Apolipoprotein B; Future  - C-Reactive Protein w/Cardiac Risk; Future    6. Flat feet     - Referral to Podiatry; Future    7. Bunion     - Referral to Podiatry; Future    8. Myalgia  Consider a TENS unit to help your muscle pain. Here is some information about how this works. It is easy to find in  [employer].  https://my.IdentitySwap.ca  - Referral to Acupuncture; Future  - Referral to Massage Therapy    9. Chronic cough  Please let me know if your cough is not resolving.  - albuterol HFA (Ventolin HFA) 108 (90 Base) MCG/ACT inhaler; Inhale 2 puffs by mouth every 4 hours as needed for shortness of breath/wheezing.  Dispense: 18 g; Refill: 0    10. Obesity, Class II, BMI 35-39.9  I will follow up with you regarding the lab results and discuss plans to manage this.  Anti-inflammatory eating patterns have the ability to reduce inflammation and reduce pain. The research shows:    https://www.pubfacts.com/detail/30294938/A-systematic-review-and-meta-analysis-of-nutrition-interventions-for-chronic-noncancer-pain  Journal of Human Nutrition Diet 2019 Apr 7;32(2):198-225. Epub 2018 Oct 7.    [place] [facility] [place] [facility] Anti-inflammatory lifestyle eating handout   https://www.fammed.PromotionalReview.nl.pdf  (read 5 mins day, quick guide)    Other sources:   -Good book, local author on topic: The [address], used by headache patiens at [facility]    -Herbert Seta Tick MD website, recipes, strategies: EternalVitamin.dk, an Integrative pain specialist [place] [facility]                 -Arthritis Foundation:  BodyEditor.com.cy.php    -Low inflammation eating food pyramids for most cultures http://dominguez.com/ RECIPES FOR MANY CULTURES  ================================================================    Leading a Healthy Life  Six tips to help improve your health and wellness     This explains how these 6 basic guidelines may improve your health and wellness:   Eat well to give your body the energy it needs.   Stay or get active.   A healthy mind is part of a healthy body.   Practice safe living  habits.   Keep your mind and body free of harmful drugs and alcohol.   Get regular health care.   Ear regularly calcium rich foods along with a supplement Vitamin D3 600-800 IU a day, Magnesium 400 mg at bed time.  Drink 48 to 64 Oz of water a day.    Tip #1: Eat well to give your body the energy it needs.   Your body needs nutritious foods to stay strong and healthy.   Here are some general eating guidelines:   Have 2 servings of fish or other seafood 2 times a week (1 serving = 4 ounces).   If you eat dairy products, choose low-fat (1%) or nonfat ones.   If you eat meat, cut down on the amount. Replace it with plant-based foods such as beans, whole grains, fruits and vegetables, and nuts and seeds.   Have less than 1,500 mg of sodium (salt) a day.   Cut down on junk food like alcohol, fatty foods, chips, candy, and other sweets.     Tip #2: Stay or get active.   Exercise for at least 30 minutes at a time, 3 times a week. Regular physical activity can help you:   Live longer and feel better   Be stronger and more flexible   Build strong bones   Prevent depression   Strengthen your immune system   Maintain a healthy body weight     Tip #3: Remember: A healthy mind is part of a healthy body.   A good state of mind can help you make healthy choices. Here are a few tips for keeping your mind healthy:   Reduce stress in your life.   Make some time every day for things that are fun.   Get enough sleep. Lack of sleep reduces how well you can concentrate, increases mood swings, and raises your risk of having a car accident.   Ask your health care provider for help if you feel depressed or anxious for more than several days in a row.     Tip #4: Practice safe living habits.   Accidents and Injuries     Accidents and injuries are the 5th leading cause of death in the [place]   Accidents in the home cause thousands of permanent injuries every year.     The most common accidents are fires, falls, and drowning. To help yourself  and your family stay safe:   Install smoke detectors on each floor of your home.   Make sure everyone in your family knows how to swim  Stay safe on the road:  Wear a seatbelt.   Do not ride with someone who has been drinking or taking drugs.   Do not speak on a cell phone or send, read, or write text messages while you are driving.   Wear a helmet when you ride a bicycle or motorcycle.   Get enough sleep at night, and do not drive when you are tired.     Hand Hygiene   Protect yourself from germs by washing your hands often. Always wash your hands:   After you change a diaper  or use the toilet   Before you start and after you finish preparing food     Tip #5: Keep your mind and body free of harmful drugs and alcohol.   Tobacco causes more health problems than any other substance. These problems include lung disease, heart disease, and many types of cancer. The nicotine in tobacco is the most addictive and widely used drug.   Too much alcohol can cause damage to your liver, heart, brain, bones, and other body tissues. Being under the influence of alcohol also increases your chance of being injured in an accident.    Alcohol can cause fetal alcohol syndrome in your children if you drink regularly when you are pregnant.   Street drugs, like marijuana, cocaine, methamphetamine, heroin, or pain pills not prescribed by your doctor can harm your health. They may be mixed with harmful substances, and using them can cause people to put themselves in dangerous situations.     Tip #6: Get regular health care.   Many people think they need to see the doctor only when they are sick. But, health care providers can also help you stay healthy.   Find a health care provider who works with you to manage your health.   Ask your health care provider what diseases you are at risk for. Learn what you can do to prevent or control them.   Get yourself and your family immunized against life-threatening diseases.            If you feel your  symptoms are life threatening, please call 911 or go to the nearest emergency department. If you have any questions, please feel free to call us at [phone] or send [place] a message by secure messaging through Mychart.  Please know that Mychart messages can take up to 48-72 hours to respond, so please call the clinic for urgent issues.      Sincerely,  Elsie Saas, MD  [facility] Medicine Physician  [facility]  Ph: [phone], Fax: [phone]     MyChart is designed to be used for non-urgent, brief, and simple medical questions and communication; it is not intended for diagnosis and management of medical concerns; MyChart is not a substitute for medical care or office visits. For your safety, your care team may recommend an appointment.

## 2021-11-30 ENCOUNTER — Other Ambulatory Visit (HOSPITAL_BASED_OUTPATIENT_CLINIC_OR_DEPARTMENT_OTHER): Payer: Self-pay

## 2021-11-30 ENCOUNTER — Other Ambulatory Visit (INDEPENDENT_AMBULATORY_CARE_PROVIDER_SITE_OTHER): Payer: Self-pay | Admitting: Family Medicine

## 2021-11-30 ENCOUNTER — Encounter (INDEPENDENT_AMBULATORY_CARE_PROVIDER_SITE_OTHER): Payer: Self-pay

## 2021-11-30 ENCOUNTER — Other Ambulatory Visit (INDEPENDENT_AMBULATORY_CARE_PROVIDER_SITE_OTHER): Payer: Self-pay

## 2021-11-30 DIAGNOSIS — Z0000 Encounter for general adult medical examination without abnormal findings: Secondary | ICD-10-CM

## 2021-11-30 DIAGNOSIS — R748 Abnormal levels of other serum enzymes: Secondary | ICD-10-CM

## 2021-11-30 DIAGNOSIS — Z8249 Family history of ischemic heart disease and other diseases of the circulatory system: Secondary | ICD-10-CM

## 2021-11-30 LAB — LIPID PANEL
Cholesterol/HDL Ratio: 4.5
HDL Cholesterol: 47 mg/dL (ref >39)
LDL Cholesterol, NIH Equation: 124 mg/dL (ref <130)
Non-HDL Cholesterol: 164 mg/dL — ABNORMAL HIGH (ref 0–159)
Total Cholesterol: 211 mg/dL — ABNORMAL HIGH (ref <200)
Triglyceride: 225 mg/dL — ABNORMAL HIGH (ref <150)

## 2021-11-30 LAB — COMPREHENSIVE METABOLIC PANEL
ALT (GPT): 36 U/L — ABNORMAL HIGH (ref 7–33)
AST (GOT): 20 U/L (ref 9–38)
Albumin: 4.2 g/dL (ref 3.5–5.2)
Alkaline Phosphatase (Total): 76 U/L (ref 31–132)
Anion Gap: 8 (ref 4–12)
Bilirubin (Total): 0.5 mg/dL (ref 0.2–1.3)
Calcium: 9.1 mg/dL (ref 8.9–10.2)
Carbon Dioxide, Total: 29 meq/L (ref 22–32)
Chloride: 103 meq/L (ref 98–108)
Creatinine: 0.75 mg/dL (ref 0.38–1.02)
Glucose: 84 mg/dL (ref 62–125)
Potassium: 4.1 meq/L (ref 3.6–5.2)
Protein (Total): 6.4 g/dL (ref 6.0–8.2)
Sodium: 140 meq/L (ref 135–145)
Urea Nitrogen: 13 mg/dL (ref 8–21)
eGFR by CKD-EPI 2021: >60 mL/min/{1.73_m2} (ref >59)

## 2021-11-30 LAB — CBC, DIFF
% Basophils: 1 %
% Eosinophils: 3 %
% Immature Granulocytes: 0 %
% Lymphocytes: 28 %
% Monocytes: 9 %
% Neutrophils: 59 %
% Nucleated RBC: 0 %
Absolute Eosinophil Count: 0.15 10*3/uL (ref 0.00–0.50)
Absolute Lymphocyte Count: 1.56 10*3/uL (ref 1.00–4.80)
Basophils: 0.05 10*3/uL (ref 0.00–0.20)
Hematocrit: 41 % (ref 36.0–45.0)
Hemoglobin: 13.5 g/dL (ref 11.5–15.5)
Immature Granulocytes: 0.01 10*3/uL (ref 0.00–0.05)
MCH: 30.3 pg (ref 27.3–33.6)
MCHC: 33.2 g/dL (ref 32.2–36.5)
MCV: 91 fL (ref 81–98)
Monocytes: 0.48 10*3/uL (ref 0.00–0.80)
Neutrophils: 3.41 10*3/uL (ref 1.80–7.00)
Nucleated RBC: 0 10*3/uL
Platelet Count: 229 10*3/uL (ref 150–400)
RBC: 4.46 10*6/uL (ref 3.80–5.00)
RDW-CV: 11.9 % (ref 11.0–14.5)
WBC: 5.66 10*3/uL (ref 4.3–10.0)

## 2021-11-30 LAB — APOLIPOPROTEIN B: Apolipoprotein B: 100 mg/dL (ref 61–150)

## 2021-11-30 LAB — CRP WITH CARDIAC RISK: C_Reactive Protein: 1.7 mg/L (ref 0.0–10.0)

## 2021-11-30 LAB — HEPATITIS C AB WITH REFLEX PCR: Hepatitis C Antibody w/Rflx PCR: NONREACTIVE

## 2021-11-30 LAB — TSH WITH REFLEXIVE FREE T4: TSH with Reflexive Free T4: 4.059 u[IU]/mL (ref 0.400–5.000)

## 2021-12-01 ENCOUNTER — Other Ambulatory Visit (INDEPENDENT_AMBULATORY_CARE_PROVIDER_SITE_OTHER): Payer: Self-pay | Admitting: Family Medicine

## 2021-12-01 LAB — HIV ANTIGEN AND ANTIBODY SCRN
HIV Antigen and Antibody Interpretation: NONREACTIVE
HIV Antigen and Antibody Result: NONREACTIVE

## 2021-12-01 LAB — HEMOGLOBIN A1C, HPLC: Hemoglobin A1C: 5.4 % (ref 4.0–6.0)

## 2021-12-01 NOTE — Telephone Encounter (Signed)
[facility] reached out to [facility] and spoke w/ pharmacist and clarified and relayed provider instructions.    No further action needed. Closing TE.

## 2021-12-01 NOTE — Telephone Encounter (Signed)
Please call pharmacy and clarify that the medication is to use on the genital area for pain with intercourse.  This is the only viscose Lidocaine in Epic. Let me know if a different prescription is needed.  Thank you!

## 2021-12-02 ENCOUNTER — Other Ambulatory Visit (INDEPENDENT_AMBULATORY_CARE_PROVIDER_SITE_OTHER): Payer: Self-pay | Admitting: Family Medicine

## 2021-12-02 DIAGNOSIS — I10 Essential (primary) hypertension: Secondary | ICD-10-CM

## 2021-12-02 LAB — VITAMIN D (25 HYDROXY)
Vit D (25_Hydroxy) Total: 30.4 ng/mL (ref 20.1–50.0)
Vitamin D2 (25_Hydroxy): 4.2 ng/mL
Vitamin D3 (25_Hydroxy): 26.2 ng/mL

## 2021-12-05 MED ORDER — HYDROCHLOROTHIAZIDE 25 MG OR TABS
ORAL_TABLET | ORAL | 0 refills | Status: DC
Start: 2021-12-05 — End: 2023-08-07

## 2021-12-05 NOTE — Telephone Encounter (Signed)
This medication is outside of the Refill Center's protocols. Please sign and close the encounter if you approve: hydrochlorothiazide     Medication not previously Rxed by provider.  Continue?  Medication never on med list in Epic.  11/29/21 - "Takes HCTZ and Losartan."    Last refill: 08/02/2021    Prior Rx per Dr. Maricela Curet per Epic Dispensing Report.    If this medication is denied please have your staff inform the patient and schedule an appointment if necessary.

## 2021-12-22 ENCOUNTER — Encounter (INDEPENDENT_AMBULATORY_CARE_PROVIDER_SITE_OTHER): Payer: Self-pay | Admitting: Family Medicine

## 2021-12-22 ENCOUNTER — Other Ambulatory Visit (HOSPITAL_BASED_OUTPATIENT_CLINIC_OR_DEPARTMENT_OTHER): Payer: Self-pay

## 2021-12-23 NOTE — Telephone Encounter (Signed)
From separate patient email:   [facility] Clinical Support Pool (supporting Elsie Saas, MD) 13 hours ago (9:24 PM)     MW  Hi Dr. Gerilyn Nestle, , I just wanted to let you know that the estring is not improving my condition at all. Do you have any other suggestions?  Thanks, [employer] to provider.    Please review and advise. Thank you.

## 2021-12-23 NOTE — Telephone Encounter (Signed)
Please contact patient to guide her where to look for her lab results-there is explanation of the results as well. We can discuss more during a visit about lipids and ALT elevation/obesity.  She needs to follow up for the vaginal discomfort.  Please schedule a visit at her earliest convenience-40 minutes please.  Thank you!

## 2021-12-23 NOTE — Telephone Encounter (Signed)
Message copy and pasted in separate TE.    Closing TE.

## 2021-12-24 NOTE — Telephone Encounter (Signed)
Patient notified.     Relayed provider message.   Sent schedule ticket.    Nothing more needed at this time, closing TE

## 2022-01-05 NOTE — Telephone Encounter (Signed)
1st attempt    LVMTCB to schedule appt w/ pcp or covering provider    CCR: if pt calls back please warm transfer to [id number]    Thank you-postponing TE

## 2022-01-07 NOTE — Telephone Encounter (Signed)
PT scheduled via mychart 01/13/22    Closing TE - no further action needed.

## 2022-01-09 ENCOUNTER — Encounter (INDEPENDENT_AMBULATORY_CARE_PROVIDER_SITE_OTHER): Payer: Self-pay | Admitting: Family Medicine

## 2022-01-12 ENCOUNTER — Other Ambulatory Visit (HOSPITAL_BASED_OUTPATIENT_CLINIC_OR_DEPARTMENT_OTHER): Payer: Self-pay

## 2022-01-13 ENCOUNTER — Telehealth (INDEPENDENT_AMBULATORY_CARE_PROVIDER_SITE_OTHER): Payer: PPO | Admitting: Family Medicine

## 2022-01-13 NOTE — Telephone Encounter (Signed)
Pt was rescheduled for 01/31/22 for a 40 min appt w/ Dr. Gerilyn Nestle.    No further action needed-closing TE

## 2022-01-13 NOTE — Telephone Encounter (Signed)
Please schedule patient on day that she chooses for 40 minutes for pelvic exam and med management. Thank you!

## 2022-01-13 NOTE — Telephone Encounter (Signed)
Can we help put patient on schedule

## 2022-01-17 ENCOUNTER — Other Ambulatory Visit (HOSPITAL_BASED_OUTPATIENT_CLINIC_OR_DEPARTMENT_OTHER): Payer: Self-pay

## 2022-01-29 ENCOUNTER — Other Ambulatory Visit: Payer: Self-pay

## 2022-01-30 NOTE — Progress Notes (Signed)
Chief Complaint   Patient presents with    Follow-Up      Medications and pelvic exam        Chief Complaint   Patient presents with    Follow-Up      Medications and pelvic exam        Subjective:     Connie [name] is a 61 year old female, with PMH relevant for   Patient Active Problem List   Diagnosis    General counseling and advice for procreative management    Spontaneous abortion    Allergic rhinitis, cause unspecified    Asthma    Insomnia    Sleep apnea    Chondromalacia    Hormone replacement therapy (HRT)    Other hyperlipidemia    Annual physical exam    Postmenopausal    Atrophic vaginitis    HTN (hypertension), benign    FH: heart disease    Flat feet    Bunion    Myalgia    Obesity, Class II, BMI 35-39.9    Chronic cough    Screening for cervical cancer    Dyspareunia in female    Screen for colon cancer    who presents on 01/31/2022 with the following concerns:  Interpreter for this visit: NO    -Dyspareunia:    Patient has atrophic vaginitis and suffering from dyspareunia.  Discussed a few weeks ago and recommended Vaginal estrogen ring and Lidocaine  Has used the cream in the past and it was helpful but eventually stopped working.  Has not been trying the Lidocaine because she was worried it was going to take the sensation away.  She continues to have significant pain on contact with the mucosa and with friction.  The Estring was $500 and patient is not going to continue. Would like to go back to the cream.  Denies abnormal vaginal discharge, no lesions and no UTI symptoms.      -Hypercholesterolemia;  We reviewed results today.  Patient has not been on a good diet while on vacation in [place].  Would like to wait for a few months but she is   Chart Review:    The 10-year ASCVD risk score (Arnett DK, et al., 2019) is: 3.7%    Values used to calculate the score:      Age: [age] years      Sex: Female      Is Non-Hispanic African American: No      Diabetic: No      Tobacco smoker: No       Systolic Blood Pressure: 104 mmHg      Is BP treated: Yes      HDL Cholesterol: 47 mg/dL      Total Cholesterol: 211 mg/dL      -Screening for colon cancer:  Patient is low risk,.  Has been trying to make an appointment with GI and it has not been possible.  Has no symptoms.     Objective:    Vitals: BP 104/73    Pulse 99    Temp 36.9 C (Temporal)    LMP 09/16/2016 (Exact Date)    SpO2 96%   Physical Exam  Vitals reviewed.   Constitutional:       General: She is not in acute distress.  Pulmonary:      Effort: Pulmonary effort is normal.   Genitourinary:     Comments: External genitalia w/o lesions and with expected postmenopausal changes, there is pale vaginal walls and no  lesions, no abnormal discharge, there is no masses. Cervix is visualized and there are no lesions. Samples are taken for pap, Vgram and yeast.  Bimanual exam is non tender with no masses palpated, no CMT.  Neurological:      Mental Status: She is alert and oriented to person, place, and time.   Psychiatric:         Mood and Affect: Mood and affect normal.         Cognition and Memory: Memory normal.         Judgment: Judgment normal.            Assessment and Plan:      Connie [name] was seen today for follow-up .    Screening for cervical cancer  -     Cervical Cancer Screening    Dyspareunia in female  Assessment & Plan:  Will await to have the results and treat as needed.  If problem is not improved switching back to estrogen cream and treating infection is any, then referral to GYN.  May use Lidocaine to help with the discomfort once infection is R/O    Orders:  -     Direct Exam Vaginal Gram Stain  -     Culture Yeast w/ Stain  -     estradiol 0.1 MG/GM vaginal cream; Place 1 g into the vagina 3 times a week.  Dispense: 42.5 g; Refill: 0  -     Referral to Obstetrics and Gynecology    Postmenopausal  -     estradiol 0.1 MG/GM vaginal cream; Place 1 g into the vagina 3 times a week.  Dispense: 42.5 g; Refill: 0  -     Referral to Obstetrics and  Gynecology    Atrophic vaginitis  -     estradiol 0.1 MG/GM vaginal cream; Place 1 g into the vagina 3 times a week.  Dispense: 42.5 g; Refill: 0  -     Referral to Obstetrics and Gynecology    Screen for colon cancer  Assessment & Plan:  Patient will continue to get an appointment and in the meantime will do a FIT     Orders:  -     Occult Blood by Immunoassay, Stool; Future    Other orders  -     ZEBRA LABELS      Total time spent for this visit: 40 minutes including chart review prior to the visit, face to face time with the patient, and documentation and coordination of care after the visit.    Diagnosis and treatment options have been explained to the patient including medications side effects.  Questions have been addressed.  Patient verbalized understanding and agrees with the plan outlined today. Patient will return to medical attention if symptoms persist or worsen. Patient advised to read the information that will be provided by his pharmacist and to call if questions.     After visit summary provided via : Mychart    Evaluation and management code for this visit was calculated based on: time     Elsie Saas, MD  [facility]  [facility], [facility]  [address] ([address]) [place] [address]  Phone: [phone]  Fax: [phone]

## 2022-01-31 ENCOUNTER — Ambulatory Visit (INDEPENDENT_AMBULATORY_CARE_PROVIDER_SITE_OTHER): Payer: PPO | Admitting: Family Medicine

## 2022-01-31 ENCOUNTER — Encounter (INDEPENDENT_AMBULATORY_CARE_PROVIDER_SITE_OTHER): Payer: Self-pay | Admitting: Family Medicine

## 2022-01-31 VITALS — BP 104/73 | HR 99 | Temp 98.4°F

## 2022-01-31 DIAGNOSIS — Z1211 Encounter for screening for malignant neoplasm of colon: Secondary | ICD-10-CM

## 2022-01-31 DIAGNOSIS — Z780 Asymptomatic menopausal state: Secondary | ICD-10-CM

## 2022-01-31 DIAGNOSIS — N941 Unspecified dyspareunia: Secondary | ICD-10-CM | POA: Insufficient documentation

## 2022-01-31 DIAGNOSIS — Z124 Encounter for screening for malignant neoplasm of cervix: Secondary | ICD-10-CM

## 2022-01-31 DIAGNOSIS — N952 Postmenopausal atrophic vaginitis: Secondary | ICD-10-CM

## 2022-01-31 MED ORDER — ESTRADIOL 0.1 MG/GM VA CREA
1.0000 g | TOPICAL_CREAM | VAGINAL | 0 refills | Status: DC
Start: 2022-01-31 — End: 2023-04-28

## 2022-01-31 NOTE — Assessment & Plan Note (Signed)
Will await to have the results and treat as needed.  If problem is not improved switching back to estrogen cream and treating infection is any, then referral to GYN.  May use Lidocaine to help with the discomfort once infection is R/O

## 2022-01-31 NOTE — Assessment & Plan Note (Signed)
Patient will continue to get an appointment and in the meantime will do a FIT

## 2022-02-01 ENCOUNTER — Other Ambulatory Visit: Payer: PPO | Attending: Family Medicine | Admitting: Family Medicine

## 2022-02-01 DIAGNOSIS — Z1151 Encounter for screening for human papillomavirus (HPV): Secondary | ICD-10-CM | POA: Insufficient documentation

## 2022-02-01 DIAGNOSIS — Z124 Encounter for screening for malignant neoplasm of cervix: Secondary | ICD-10-CM | POA: Insufficient documentation

## 2022-02-01 LAB — HPV REFLEX TO PAP
HPV 16 Genotype: NEGATIVE
HPV 18 Genotype: NEGATIVE
High Risk HPV Screening: NEGATIVE
Other High Risk HPV Genotype: NEGATIVE

## 2022-02-01 LAB — VAGINAL GRAM STAIN: Gram Smear: NONE SEEN

## 2022-02-02 ENCOUNTER — Encounter (INDEPENDENT_AMBULATORY_CARE_PROVIDER_SITE_OTHER): Payer: Self-pay | Admitting: Family Medicine

## 2022-02-06 LAB — R/O YEAST CULT W/DIRECT EXAM: Stain For Fungus: NONE SEEN

## 2022-02-09 ENCOUNTER — Other Ambulatory Visit (HOSPITAL_BASED_OUTPATIENT_CLINIC_OR_DEPARTMENT_OTHER): Payer: PPO

## 2022-02-09 ENCOUNTER — Encounter (INDEPENDENT_AMBULATORY_CARE_PROVIDER_SITE_OTHER): Payer: PPO | Admitting: Family Medicine

## 2022-02-09 ENCOUNTER — Ambulatory Visit
Admission: RE | Admit: 2022-02-09 | Discharge: 2022-02-09 | Disposition: A | Discharge status: Home / Self Care | Payer: PPO | Attending: Nuclear Medicine | Admitting: Nuclear Medicine

## 2022-02-09 DIAGNOSIS — Z780 Asymptomatic menopausal state: Secondary | ICD-10-CM | POA: Insufficient documentation

## 2022-02-16 ENCOUNTER — Ambulatory Visit: Payer: PPO | Attending: Obstetrics & Gynecology | Admitting: Obstetrics & Gynecology

## 2022-02-16 VITALS — BP 131/82 | HR 88 | Temp 98.1°F | Resp 16 | Ht 61.0 in | Wt 183.4 lb

## 2022-02-16 DIAGNOSIS — N94819 Vulvodynia, unspecified: Secondary | ICD-10-CM | POA: Insufficient documentation

## 2022-02-16 DIAGNOSIS — N941 Unspecified dyspareunia: Secondary | ICD-10-CM | POA: Insufficient documentation

## 2022-02-16 DIAGNOSIS — Z780 Asymptomatic menopausal state: Secondary | ICD-10-CM | POA: Insufficient documentation

## 2022-02-16 DIAGNOSIS — N952 Postmenopausal atrophic vaginitis: Secondary | ICD-10-CM | POA: Insufficient documentation

## 2022-02-16 MED ORDER — BETAMETHASONE DIPROPIONATE 0.05 % EX OINT
TOPICAL_OINTMENT | Freq: Every day | CUTANEOUS | 3 refills | Status: DC
Start: 2022-02-16 — End: 2023-12-03

## 2022-02-16 NOTE — Progress Notes (Signed)
{gynclinics:115227}  {neworreturn:120191} GYNECOLOGY VISIT    Patient Referred By: Connie Saas, MD  Patient's PCP: Connie Saas, MD    Patient's preferred name: Connie [name]  Patient's pronouns: {Preferred Pronouns:500137402}    {[place]  [phone] E&M codes are now billed on either MDM or total time. You can document only what is medically necessary. :999}   Chief Complaint   Patient presents with   . New Patient        Subjective: {VT  History items are no longer counted. You can document only relevant history and ROS items  Problem List  Medical Hx  Surgical Hx  Family Hx  Substance & Sexual Hx  Social Documentation  Obstetric Hx   Birth Hx :999}    Connie [name] is a 61 year old female who presents on 02/16/2022.    ***    # dyspareunia -   - tenderness just inside the vagina  - even with adequate arousal, still very sensitive  - ongoing, daily irritation, can be irritated with wiping  -   -   - initial estrogen cream helped for a the first few months  - EstRing for 3 months, but very expensive - noticed no change with the use  -      OB HISTORY   G***P***  OB History     Gravida   7    Para   1    Term   1    Preterm        AB   3    Living   1       SAB   3    IAB        Ectopic        Multiple        Live Births   3           # Date GA Outcome Labor Sex Weight Anes    1   Spontaneous Abortion            2   Spontaneous Abortion            3   Spontaneous Abortion            4   Term            5 11/28/95  Gravida            6 11/27/96  Gravida            7 08/17/98 [redacted]w[redacted]d Gravida 32h 72m Female 3.856 kg (8 lb 8 oz)     Name: Connie [name]             GYN HISTORY   Menstrual Hx: {menstrualhx:120195}  Sexual history: {sexualhx:120278:o}  Cervical cancer screening hx: {cervicalcancerhistory:120282}  Gardasil: {YES/NO:63::"YES"}  Contraception: {method}. Happy with method? {YES/NO:63::"YES"}. Needs emergency contraception Rx? {YES/NO:24023}.  Fertility plans: {fertilityplans:120281}  History of  STIs: ***, {would/not:18918} like testing today  History of sexual assault/abuse: {sexualassaulthx:120283}  History of trauma or severe pain with pelvic exams: {YES/NO:63::"YES"}  Breast health: {breasthealth:120284}  GYN surgery: ***       Objective: {VT  Physical exam items are no longer counted. You can document only relevant examination findings  Vitals Flowsheet  Labs  Imaging :999}   Vitals: BP 131/82   Pulse 88   Temp 36.7 C (Temporal)   Resp 16   Ht 5\' 1"  (1.549 m)   Wt 83.2 kg (183 lb 6.4 oz)   LMP  09/16/2016 (Exact Date)   BMI 34.65 kg/m   Physical Exam  General: {GENERAL APPEARANCE:50::"healthy","alert","no distress"}.  Neck: {NECK EXAM:304::"Supple, no adenopathy, thyroid symmetric, normal size, without nodules"}.  ***Breasts:  Inspection:  {BREASTS - INSPECTION:103735::"Normal breast symmetry,  normal nipples without discharge.No skin lesions."} Palpation: {BREAST - PALPATION:103736::"Normal breast tissue bilaterally, no tenderness, no suspicious masses."} Axillary nodes: {LYMPHATIC - AXILLARY NODES:103718::"No adenopathy."}  Cardiac: {CARDIAC - PEDAL VASCULATURE:103704::"Normal pulses in the lower extremities with normal capillary refill"}, {CARDIAC - EXTREMITIES:103705::"No edema or varicosities."}  Respiratory: {RESPIRATORY - INSPECTION:103690::"Normal respiratory effort and chest wall movement with respiration."}   Abdomen: {ABDOMEN EXAM:601::"soft, non-tender, no HSM or masses."}  Psychiatric:  Affect: {EXAM; APPROPRIATE/PLEASANT/COOPERATIVE/OTHER:500150014}  Neurologic:  Gait:  {MUSCULOSKELETAL - GAIT AND STATION:103720::"Normal."}  Skin: {SKIN EXAM:101::"Skin color, texture, turgor normal. No rashes or concerning lesions"} on visible areas.  Pelvic Exam: {PELVIC:709::"External genitalia normal","normal bartholin/skene/urethral meatus/anus.","Vagina is rugated and well-estrogenized","cervix normal in appearance","no CMT","no bladder tenderness","uterus normal size, shape, and  consistency","no adnexal masses or tenderness"}.     Exam chaperoned by {CHAPERONE:105165::"medical assistant"}    {gyn common lab results:108396}       Assessment and Plan: {VT  Problem List  Meds & Orders  HM  Care Gaps :999}   {VT  LOS Calculator  Job aid  AMA's MDM grid :999}  Connie [name] is a 61 year old G*** who presents for ***    {Assessment & Plan:115017}    ***    # HCM:  {gynhcm:120192}    Follow Up Plan: ***    {Seen/discussed with an attending? (Optional):114675}    Connie Gibson, MD   {sig:120307}

## 2022-02-22 NOTE — Patient Instructions (Signed)
DEPARTMENT OF OB/GYN  VULVOVAGINAL SPECIALTY CLINIC  GUIDELINES FOR VULVAR SKIN CARE    BATHING AND HYGIENE  Use unscented products/sensitive skin products for washing  Suggestions: Cetaphil, Neutrogena, Dove  Avoid using soap around the genitals. We recommend gentle rinsing with water only.   Avoid bubble baths and bath salts.  Avoid scrubbing, shaving, waxing, or vigorous drying of the genitals. Pat dry or use a hair dryer on a cool setting.  Avoid feminine sprays, douching, baby wipes, scented toilet paper, and scented pads and tampons. Consider use of all cotton pads/tampons.      LAUNDRY  Use unscented laundry detergent (no perfumes, no dyes).  Suggestions: All Free and Clear, Woolite Gentle Cycle  Use only the recommended amount of detergent for your washing machine, and consider double rinsing your underwear.                                                                                                                                 Avoid fabric softener or dryer sheets on any clothing or towels that contacts the vulva.      CLOTHING  Avoid pantyhose.  Remove bathing suit and any exercise clothes after activity.  Wear white, all cotton underwear. It is ok to sleep with no underwear at night.   Avoid tight fitting and synthetic fabric clothing.      LUBRICANTS AND SKIN CARE PRODUCTS  Use a water based lubricant (no fragrance)  Suggestions: Astroglide, Slippery Stuff  Coconut oil may be used for dry/cracked skin.  Do not douche.  Avoid spermicides, Vagisil, and Monistat.  Avoid over the counter creams and treatments unless told to use them.  Avoid genital hair removing products.

## 2022-02-23 ENCOUNTER — Encounter (HOSPITAL_BASED_OUTPATIENT_CLINIC_OR_DEPARTMENT_OTHER): Payer: Self-pay | Admitting: Obstetrics & Gynecology

## 2022-03-27 ENCOUNTER — Encounter (HOSPITAL_BASED_OUTPATIENT_CLINIC_OR_DEPARTMENT_OTHER): Payer: Self-pay

## 2022-03-29 ENCOUNTER — Ambulatory Visit
Admission: RE | Admit: 2022-03-29 | Discharge: 2022-03-29 | Disposition: A | Discharge status: Home / Self Care | Payer: PPO | Attending: Diagnostic Radiology | Admitting: Diagnostic Radiology

## 2022-03-29 ENCOUNTER — Encounter (HOSPITAL_BASED_OUTPATIENT_CLINIC_OR_DEPARTMENT_OTHER): Payer: Self-pay

## 2022-03-29 DIAGNOSIS — Z0000 Encounter for general adult medical examination without abnormal findings: Secondary | ICD-10-CM | POA: Insufficient documentation

## 2022-04-13 ENCOUNTER — Other Ambulatory Visit (INDEPENDENT_AMBULATORY_CARE_PROVIDER_SITE_OTHER): Payer: Self-pay | Admitting: Family Medicine

## 2022-04-13 DIAGNOSIS — R053 Chronic cough: Secondary | ICD-10-CM

## 2022-04-14 MED ORDER — ALBUTEROL SULFATE HFA 108 (90 BASE) MCG/ACT IN AERS
INHALATION_SPRAY | RESPIRATORY_TRACT | 0 refills | Status: AC
Start: 2022-04-14 — End: ?

## 2022-08-05 ENCOUNTER — Telehealth (INDEPENDENT_AMBULATORY_CARE_PROVIDER_SITE_OTHER): Payer: Self-pay | Admitting: Family Medicine

## 2022-08-05 NOTE — Telephone Encounter (Signed)
1st attempt    LVMTCB, Primas and Mychart message w/ scheduling ticket.    CCR - pls warm transfer for scheduling    Thank you! - postponing

## 2022-08-05 NOTE — Telephone Encounter (Signed)
Covid booster clinic support staff schedule please

## 2022-08-11 ENCOUNTER — Ambulatory Visit (INDEPENDENT_AMBULATORY_CARE_PROVIDER_SITE_OTHER): Payer: PPO

## 2022-08-12 ENCOUNTER — Ambulatory Visit (INDEPENDENT_AMBULATORY_CARE_PROVIDER_SITE_OTHER): Payer: PPO

## 2022-09-01 ENCOUNTER — Encounter (HOSPITAL_BASED_OUTPATIENT_CLINIC_OR_DEPARTMENT_OTHER): Payer: Self-pay

## 2022-09-13 ENCOUNTER — Other Ambulatory Visit: Payer: Self-pay

## 2022-10-05 NOTE — Telephone Encounter (Signed)
2nd attempt - LMTCB

## 2022-10-10 NOTE — Telephone Encounter (Signed)
Resent mychart ticket and primas    Closing TE    CCR - pls help schedule clinical support appt on clinical staff schedule

## 2023-01-08 ENCOUNTER — Other Ambulatory Visit (HOSPITAL_BASED_OUTPATIENT_CLINIC_OR_DEPARTMENT_OTHER): Payer: Self-pay | Admitting: Gastroenterology

## 2023-01-08 DIAGNOSIS — Z1211 Encounter for screening for malignant neoplasm of colon: Secondary | ICD-10-CM

## 2023-01-23 ENCOUNTER — Encounter (INDEPENDENT_AMBULATORY_CARE_PROVIDER_SITE_OTHER): Payer: Self-pay

## 2023-03-06 ENCOUNTER — Encounter (INDEPENDENT_AMBULATORY_CARE_PROVIDER_SITE_OTHER): Payer: Self-pay | Admitting: Family Medicine

## 2023-03-30 NOTE — Telephone Encounter (Signed)
Notified patient FIT KIT sent in mail today    [address]   [place] [address]

## 2023-04-19 ENCOUNTER — Other Ambulatory Visit
Admission: RE | Admit: 2023-04-19 | Discharge: 2023-04-19 | Disposition: A | Discharge status: Home / Self Care | Payer: PPO | Source: Ambulatory Visit | Attending: Gastroenterology | Admitting: Gastroenterology

## 2023-04-19 ENCOUNTER — Other Ambulatory Visit (HOSPITAL_BASED_OUTPATIENT_CLINIC_OR_DEPARTMENT_OTHER): Payer: Self-pay | Admitting: Gastroenterology

## 2023-04-19 DIAGNOSIS — Z1212 Encounter for screening for malignant neoplasm of rectum: Secondary | ICD-10-CM

## 2023-04-19 DIAGNOSIS — Z1211 Encounter for screening for malignant neoplasm of colon: Secondary | ICD-10-CM | POA: Insufficient documentation

## 2023-04-19 LAB — OCCULT BLOOD BY IA, STL: Occult Bld 1 Result: NEGATIVE

## 2023-04-26 ENCOUNTER — Encounter (HOSPITAL_BASED_OUTPATIENT_CLINIC_OR_DEPARTMENT_OTHER): Payer: Self-pay

## 2023-04-27 ENCOUNTER — Encounter (INDEPENDENT_AMBULATORY_CARE_PROVIDER_SITE_OTHER): Payer: Self-pay | Admitting: Family Medicine

## 2023-04-27 NOTE — Progress Notes (Signed)
Chief Complaint   Patient presents with    Wellness       Connie [name] is a 62 year old female with PMH significant for   Patient Active Problem List   Diagnosis    General counseling and advice for procreative management    Spontaneous abortion    Allergic rhinitis, cause unspecified    Asthma    Insomnia    Sleep apnea    Chondromalacia    Hormone replacement therapy (HRT)    Other hyperlipidemia    Annual physical exam    Postmenopausal    Atrophic vaginitis    HTN (hypertension), benign    FH: heart disease    Flat feet    Bunion    Myalgia    Obesity, Class II, BMI 35-39.9    Chronic cough    Screening for cervical cancer    Dyspareunia in female    Screen for colon cancer     here today for a preventive health visit.     An interpreter was not needed for the visit.  Other problems or concerns today:      #HYPERTENSION:  Has been stable on the medication.  Takes HCTZ and Losartan.  Would like to transfer the medication to me but not needing a refill now.  Feeling well.      #Hyperlipidemia:  Returned from [employer] gained some WT      The 10-year ASCVD risk score (Arnett DK, et al., 2019) is: 4.8%    Values used to calculate the score:      Age: 16 years      Sex: Female      Is Non-Hispanic African American: No      Diabetic: No      Tobacco smoker: No      Systolic Blood Pressure: 113 mmHg      Is BP treated: Yes      HDL Cholesterol: 47 mg/dL      Total Cholesterol: 211 mg/dL    # Trouble with sleep:  Believes to snore at night  Tracker shows many awakenings during the night  Feels tired  Some nights not able to fall asleep  Wakes up more if drinking alcohol- occasional  Tried Ambien in the past  Would prefers a different medication  Worries about dementia    #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    Desonide 0.05 % External Ointment 1 Application , Topical, 2 times daily PRN, Location to apply: abdomen    [START ON 04/30/2023] estradiol (ESTRACE) 1 g, Vaginal, 3 times weekly    fluticasone propionate 50 MCG/ACT nasal spray Nasal    hydroCHLOROthiazide 25 MG tablet TAKE ONE TABLET BY MOUTH EVERY DAY    hydrOXYzine pamoate (VISTARIL) 25-50 mg, Oral, Nightly PRN    losartan 50 MG tablet No dose, route, or frequency recorded.        PHQ2 Total Score: 0      LIFESTYLE  Social History  Socioeconomic History    Marital status: Married     Spouse name: Not on file    Number of children: Not on file    Years of education: Not on file    Highest education level: Not on file   Occupational History    Not on file   Tobacco Use    Smoking status: Former     Current packs/day: 0.00     Average packs/day: 0.5 packs/day for 10.0 years (5.0 ttl pk-yrs)     Types: Cigarettes     Start date: 08/28/1979     Quit date: 08/27/1989     Years since quitting: 33.6    Smokeless tobacco: Never   Substance and Sexual Activity    Alcohol use: Yes     Comment: 1-2 every coulpe of weeks    Drug use: No    Sexual activity: Yes     Partners: Male     Comment: mutually mongamous since 1987   Other Topics Concern    Blood Transfusions Not Asked    Occupational Exposure Not Asked    Hobby Hazards Not Asked    Sleep Concern Not Asked    Stress Concern Not Asked    Weight Concern Not Asked    Special Diet Yes     Comment: stress eating    Back Care Not Asked    Exercise No     Comment: likes to walk    Bike Helmet Not Asked    Self-Exams Not Asked    Falls in past year Not Asked    Preferred learning style Not Asked    Personal/religious/cultural values that affect care Not Asked    Feels safe at home  Not Asked    Military Service Not Asked    Caffeine Concern Not Asked    Seat Belt Not Asked   Social History Narrative    Mom for 1 child, husband is a Administrator.  Has lived in [place] since 1993.     Pt used to do glass work         09/20/2016    She had her husband lost their 75 you daughter to an overdose in April 2017. Her father passed at an advanced age last week. She has been working in grief counseling. She is interested in more general ongoing counseling.         Social Determinants of Health     Financial Resource Strain: Not on file   Food Insecurity: Not on file   Transportation Needs: Not on file   Physical Activity: Not on file   Stress: Not on file   Social Connections: Not on file   Intimate Partner Violence: Not on file   Housing Stability: Not on file     Dietary habits: healthy diet in general  Needs to cut back on sweets, Calcium: dairy, cheese, yogurt  leafy greens  Exercise habits: yoga  Regular dental exams: yes      GYN HISTORY  OB History   Gravida Para Term Preterm AB Living   7 1 1  0 3 1   SAB IAB Ectopic Multiple Live Births   3 0 0 0 1   Obstetric Comments   had a w/u for her SAbs; had a successful pregnancy thereafter while on progest      Currently having periods: NO-no period for 10 years  Last pap: 2021     CANCER SCREENING  Family history of colon cancer: NO  Family history of uterine or ovarian cancer: NO  Family history of breast cancer: NO  Prior mammogram: YES     Family History       Problem (# of Occurrences) Relation (Name,Age of Onset)    Heart Disease (2) Father, Brother    Lipids (1) Mother    Other (1) Father: Crohns    Breast Cancer (1) Paternal Aunt (60)          Current Outpatient Medications   Medication Instructions    Albuterol Sulfate HFA (VENTOLIN HFA) 108 (90 Base) MCG/ACT Inhalation Aero Soln 2 puffs, Inhalation, Every 4 hours PRN    Desonide 0.05 % External Ointment 1 application, Topical, 2 times daily PRN, Location to apply: abdomen    Estradiol 0.075  MG/24HR Transdermal PATCH BIWEEKLY APPLY 1 PATCH TO SKIN TWICE A WEEK    MedroxyPROGESTERone Acetate 2.5 MG Oral Tab TAKE ONE TABLET BY MOUTH ONCE DAILY       REVIEW OF SYSTEMS  Review of Systems   Constitutional:  Negative for fatigue.   HENT:  Negative for ear pain, hearing loss and sore throat.    Eyes:  Negative for pain and visual disturbance.   Respiratory:  Negative for chest tightness and shortness of breath.    Cardiovascular:  Negative for chest pain and palpitations.   Gastrointestinal:  Negative for abdominal pain, blood in stool, constipation, diarrhea and rectal pain.   Endocrine: Negative.    Genitourinary:  Positive for dyspareunia. Negative for dysuria, flank pain, vaginal bleeding and vaginal pain.   Musculoskeletal:  Negative for arthralgias.   Skin: Negative.    Allergic/Immunologic: Negative.    Neurological:  Negative for dizziness, seizures and headaches.   Hematological: Negative.    Psychiatric/Behavioral:  Negative for sleep disturbance. The patient is not nervous/anxious.          BP 113/78   Pulse 83   Temp 36.2 C (Temporal)   Resp 16   Ht 5' 2.21" (1.58 m) Comment: WITH SHOES  Wt 76 kg (167 lb 8.8 oz)   LMP 09/16/2016 (Exact Date)   SpO2 98%   BMI 30.44 kg/m   Physical Exam  Vitals reviewed.   Constitutional:       General: She is not in acute distress.     Appearance: Normal appearance.   HENT:      Head: Normocephalic and atraumatic.      Right Ear: Tympanic membrane, ear canal and external ear normal. There is no impacted cerumen.      Left Ear: Tympanic membrane, ear canal and external ear normal. There is no impacted cerumen.      Nose: No congestion.      Mouth/Throat:      Mouth: Mucous membranes are moist.      Pharynx: Oropharynx is clear.   Eyes:      General:         Right eye: No discharge.         Left eye: No discharge.      Extraocular Movements: Extraocular movements intact.      Conjunctiva/sclera: Conjunctivae normal.      Pupils: Pupils are equal, round, and  reactive to light.   Cardiovascular:      Rate and Rhythm: Normal rate and regular rhythm.      Heart sounds: No murmur heard.  Pulmonary:      Effort: Pulmonary effort is normal.      Breath sounds: Normal breath sounds. No wheezing.  Chest:      Comments: deferred  Abdominal:      General: Bowel sounds are normal. There is no distension.      Palpations: Abdomen is soft. There is no mass.      Tenderness: There is no abdominal tenderness. There is no right CVA tenderness, left CVA tenderness, guarding or rebound.      Hernia: No hernia is present.   Musculoskeletal:         General: No tenderness. Normal range of motion.      Cervical back: Normal range of motion.   Lymphadenopathy:      Cervical: No cervical adenopathy.   Skin:     Findings: No lesion or rash.   Neurological:      General: No focal deficit present.      Mental Status: She is alert and oriented to person, place, and time.   Psychiatric:         Mood and Affect: Mood and affect normal.         Behavior: Behavior normal.         Thought Content: Thought content normal.         Cognition and Memory: Memory normal.         Judgment: Judgment normal.            ASSESSMENT/PLAN:     Trey was seen today for wellness.    Annual physical exam  -     TSH with Reflexive Free T4; Future  -     Lipid Panel; Future  -     Comprehensive Metabolic Panel; Future  -     Hemoglobin A1C, HPLC; Future  -     Lipoprotein(a) (Sendout); Future    Mild intermittent asthma without complication  -     pneumococcal 20-valent conjugate vaccine (Prevnar 20) injection 0.5 mL    Vulvodynia  -     Referral to Obstetrics and Gynecology; Future    Other insomnia  -     hydrOXYzine pamoate 25 MG capsule; Take 1-2 capsules (25-50 mg) by mouth at bedtime as needed (inmonia).  Dispense: 30 capsule; Refill: 0    Snoring  -     Referral to [facility]; Future    Need for Tdap vaccination  -     Tdap vaccine (Boostrix) injection 0.5 mL    Dyspareunia in female  -     estradiol 0.1  MG/GM vaginal cream; Place 1 g into the vagina 3 times a week.  Dispense: 42.5 g; Refill: 0    Postmenopausal  -     estradiol 0.1 MG/GM vaginal cream; Place 1 g into the vagina 3 times a week.  Dispense: 42.5 g; Refill: 0    Atrophic vaginitis  -     estradiol 0.1 MG/GM vaginal cream; Place 1 g into the vagina 3 times a week.  Dispense: 42.5 g; Refill: 0    Need for vaccination  -     pneumococcal 20-valent conjugate vaccine (Prevnar 20) injection 0.5 mL    Other orders  -     ZEBRA LABELS      Follow up pending results  Consider follow up with GYN for lesion on vulva  Recommended sleep study     We discussed and/or I provided written information regarding preventive measures and health maintenance such as healthy diet and exercise, need for rest and nurturing relationships, sun screen and skin cancer prevention and surveillance, use of safety equipment  including helmets, seat belts and fire alarms.    Declines Shingles vaccine today.  Will come back for Tdap and PCV 20 on Monday     Follow-up: 1 year for annual preventive exam.      Elsie Saas, MD  [facility]  [facility]

## 2023-04-28 ENCOUNTER — Ambulatory Visit
Admit: 2023-04-28 | Discharge: 2023-04-28 | Disposition: A | Discharge status: Home / Self Care | Payer: PPO | Attending: Diagnostic Radiology | Admitting: Diagnostic Radiology

## 2023-04-28 ENCOUNTER — Ambulatory Visit (INDEPENDENT_AMBULATORY_CARE_PROVIDER_SITE_OTHER): Payer: PPO | Admitting: Family Medicine

## 2023-04-28 ENCOUNTER — Encounter (INDEPENDENT_AMBULATORY_CARE_PROVIDER_SITE_OTHER): Payer: Self-pay | Admitting: Family Medicine

## 2023-04-28 VITALS — BP 113/78 | HR 83 | Temp 97.1°F | Resp 16 | Ht 62.21 in | Wt 167.5 lb

## 2023-04-28 DIAGNOSIS — J4520 Mild intermittent asthma, uncomplicated: Secondary | ICD-10-CM

## 2023-04-28 DIAGNOSIS — N941 Unspecified dyspareunia: Secondary | ICD-10-CM

## 2023-04-28 DIAGNOSIS — Z23 Encounter for immunization: Secondary | ICD-10-CM

## 2023-04-28 DIAGNOSIS — N94819 Vulvodynia, unspecified: Secondary | ICD-10-CM

## 2023-04-28 DIAGNOSIS — Z1231 Encounter for screening mammogram for malignant neoplasm of breast: Secondary | ICD-10-CM | POA: Insufficient documentation

## 2023-04-28 DIAGNOSIS — N952 Postmenopausal atrophic vaginitis: Secondary | ICD-10-CM

## 2023-04-28 DIAGNOSIS — Z780 Asymptomatic menopausal state: Secondary | ICD-10-CM

## 2023-04-28 DIAGNOSIS — Z0000 Encounter for general adult medical examination without abnormal findings: Secondary | ICD-10-CM

## 2023-04-28 DIAGNOSIS — R0683 Snoring: Secondary | ICD-10-CM

## 2023-04-28 DIAGNOSIS — G4709 Other insomnia: Secondary | ICD-10-CM

## 2023-04-28 MED ORDER — PNEUMOCOCCAL 20-VAL CONJ VACC 0.5 ML IM SUSY
0.5000 mL | PREFILLED_SYRINGE | Freq: Once | INTRAMUSCULAR | Status: AC
Start: 2023-05-07 — End: 2023-04-30
  Administered 2023-04-30: 0.5 mL via INTRAMUSCULAR

## 2023-04-28 MED ORDER — TETANUS-DIPHTH-ACELL PERTUSSIS 5-2.5-18.5 LF-MCG/0.5 IM SUSP WRAPPER
0.5000 mL | Freq: Once | INTRAMUSCULAR | Status: AC
Start: 2023-04-30 — End: 2023-04-30
  Administered 2023-04-30: 0.5 mL via INTRAMUSCULAR

## 2023-04-28 MED ORDER — HYDROXYZINE PAMOATE 25 MG OR CAPS
25.0000 mg | ORAL_CAPSULE | Freq: Every evening | ORAL | 0 refills | Status: AC | PRN
Start: 2023-04-28 — End: ?

## 2023-04-28 MED ORDER — ESTRADIOL 0.1 MG/GM VA CREA
1.0000 g | TOPICAL_CREAM | VAGINAL | 0 refills | Status: DC
Start: 2023-04-30 — End: 2023-11-16

## 2023-04-28 NOTE — Patient Instructions (Addendum)
Some tips for a nightly routine are   Keep routine consistent- following same steps overnight (pjs, brushing teeth, light stretching)   At least 30 mins to win down  Dim your light especially blue light (tv, cellphones, tablets, computer) - lights especially blue lights can hinder melatonin and will not prevent the melatonin from working properly   Cut down on caffeine in the afternoon and evening   Don't eat at least 3 hours before bed   Restriction in bed activity- build a link in your mind between sleep and bed, it's best to only use your bed for sleep     Online Tools for Insomnia Treatment  On-line tools for insomnia may be helpful and utilize principles of cognitive behavioral therapy for insomnia (CBT-I).  Here are some websites/apps to consider:     1. CBT-I Coach: MapSeats.co.uk  -- developed at the [place], free to use     2. SHUT-I: SecurityAd.es  -- personalized program, validated, fee required     3. Sleepio: https://www.sleepio.com/  -- also validated [employer], fee required     4. Slumber Camp: https://slumbercamp.co/  --developed by a [place] resident, Dr. Geronimo Boot, fee required (scholarships available)      5. [employer] CBT-i 5 week course.   --The course is via .pdf files and can be downloaded at www.MedicationWarning.com.br. As of 04/2018 this course cost $50 for the basic version.     6. Somryst: FDA approved phone app CBT-I. Requires prescription from me. Expensive [phone]), but insurance or you FSA/HSA funds may cover the cost.     Leading a Healthy Life  Six tips to help improve your health and wellness     This explains how these 6 basic guidelines may improve your health and wellness:   Eat well to give your body the energy it needs.   Stay or get active.   A healthy mind is part of a healthy body.   Practice safe living habits.   Keep your mind and body free of harmful drugs and alcohol.   Get regular health care.   Take calcium daily, in supplements of 500 mg  twice a day (or once daily and supplement the rest with calcium rich foods) along with Vitamin D3 600-800 IU a day, Magnesium 400 mg at bed time.  Drink 48 to 64 Oz of water a day.    Tip #1: Eat well to give your body the energy it needs.   Your body needs nutritious foods to stay strong and healthy.   Here are some general eating guidelines:   Have 2 servings of fish or other seafood 2 times a week (1 serving = 4 ounces).   If you eat dairy products, choose low-fat (1%) or nonfat ones.   If you eat meat, cut down on the amount. Replace it with plant-based foods such as beans, whole grains, fruits and vegetables, and nuts and seeds.   Have less than 1,500 mg of sodium (salt) a day.   Cut down on "junk food" like alcohol, fatty foods, chips, candy, and other sweets.     Tip #2: Stay or get active.   Exercise for at least 30 minutes at a time, 5 times a week. Regular physical activity can help you:   Live longer and feel better   Be stronger and more flexible   Build strong bones   Prevent depression   Strengthen your immune system   Maintain a healthy body weight  Tip #3: Remember: A healthy mind is part of a healthy body.   A good state of mind can help you make healthy choices. Here are a few tips for keeping your mind healthy:   Reduce stress in your life.   Make some time every day for things that are fun.   Get enough sleep. Lack of sleep reduces how well you can concentrate, increases mood swings, and raises your risk of having a car accident.   Ask your health care provider for help if you feel depressed or anxious for more than several days in a row.     Tip #4: Practice safe living habits.   Accidents and Injuries     Accidents and injuries are the 5th leading cause of death in the [place]   Accidents in the home cause thousands of permanent injuries every year.     The most common accidents are fires, falls, and drowning. To help yourself and your family stay safe:   Install smoke detectors on each floor  of your home.   Make sure everyone in your family knows how to swim  Stay safe on the road:  Wear a seatbelt.   Do not ride with someone who has been drinking or taking drugs.   Do not speak on a cell phone or send, read, or write text messages while you are driving.   Wear a helmet when you ride a bicycle or motorcycle.   Get enough sleep at night, and do not drive when you are tired.     Hand Hygiene   Protect yourself from germs by washing your hands often. Always wash your hands:   After you change a diaper or use the toilet   Before you start and after you finish preparing food     Tip #5: Keep your mind and body free of harmful drugs and alcohol.   Tobacco causes more health problems than any other substance. These problems include lung disease, heart disease, and many types of cancer. The nicotine in tobacco is the most addictive and widely used drug.   Too much alcohol can cause damage to your liver, heart, brain, bones, and other body tissues. Being under the influence of alcohol also increases your chance of being injured in an accident.    Alcohol can cause fetal alcohol syndrome in your children if you drink regularly when you are pregnant.      Cannabis is associated to increase risk of cardiovascular risk and stroke as well as increase risk for anxiety, psychosis and cognitive side effects. Smoking or vaping Marijuana can increase your risk or lung disease and cancer.    Tip #6: Get regular health care:  Schedule your preventive visits annually.  See your dentist every 6 months  Check your vision every 2 years or as recommended for you.  At the moment, this is the list of the preventive steps recommended for your age and medical history as we discussed during the visit:  Health Maintenance   Topic Date Due    Pneumococcal Vaccine: Pediatrics (0-5 years) and At-Risk Patients (6-64 years) (1 of 2 - PCV) Never done    DTaP, Tdap and Td Vaccines (2 - Td or Tdap) 07/31/2021    COVID-19 Vaccine (6 - 2023-24  season) 07/28/2022    Zoster Vaccine (1 of 2) 03/10/2024 (Originally 09/22/2010)    Influenza Vaccine (Season Ended) 08/28/2023    Colorectal Cancer Screening  04/18/2024    Breast Cancer Screening  04/27/2024    Depression Screening (PHQ-2)  04/27/2024    Diabetes Screening  11/30/2024    Lipid Disorders Screening  11/30/2026    Cervical Cancer Screening  02/02/2027    Hepatitis C Screening  Completed    HIV Screening  Completed    Hepatitis A Vaccine  Aged Out    Hepatitis B Vaccine  Aged Out    HPV Vaccine  Aged Out             In the event of symptoms that are life threatening, please call 911 or go to the nearest emergency department. If you have any questions, please feel free to call us at [phone] or send [place] a message by secure messaging through Mychart.  Please know that Mychart messages can take up to 48-72 hours to respond, so please call the clinic for time sensitive issues.    Please let us know if you have any follow up questions of concerns.      Sincerely,  Elsie Saas, MD  [facility] Medicine Physician  [facility]  Ph: [phone], Fax: [phone]     MyChart is designed to be used for non-urgent, brief, and simple medical questions and communication; it is not intended for diagnosis and management of medical concerns; MyChart is not a substitute for medical care or office visits. For your safety, your care team may recommend an appointment.

## 2023-04-30 ENCOUNTER — Ambulatory Visit (INDEPENDENT_AMBULATORY_CARE_PROVIDER_SITE_OTHER): Payer: PPO

## 2023-04-30 ENCOUNTER — Other Ambulatory Visit (INDEPENDENT_AMBULATORY_CARE_PROVIDER_SITE_OTHER): Payer: Self-pay | Admitting: Family Medicine

## 2023-04-30 DIAGNOSIS — Z23 Encounter for immunization: Secondary | ICD-10-CM

## 2023-04-30 DIAGNOSIS — J4520 Mild intermittent asthma, uncomplicated: Secondary | ICD-10-CM

## 2023-04-30 DIAGNOSIS — Z0000 Encounter for general adult medical examination without abnormal findings: Secondary | ICD-10-CM

## 2023-04-30 LAB — COMPREHENSIVE METABOLIC PANEL
ALT (GPT): 19 U/L (ref 7–33)
AST (GOT): 17 U/L (ref 9–38)
Albumin: 4.3 g/dL (ref 3.5–5.2)
Alkaline Phosphatase (Total): 79 U/L (ref 31–132)
Anion Gap: 9 (ref 4–12)
Bilirubin (Total): 0.6 mg/dL (ref 0.2–1.3)
Calcium: 9.2 mg/dL (ref 8.9–10.2)
Carbon Dioxide, Total: 29 meq/L (ref 22–32)
Chloride: 101 meq/L (ref 98–108)
Creatinine: 0.67 mg/dL (ref 0.38–1.02)
Glucose: 86 mg/dL (ref 62–125)
Potassium: 4.1 meq/L (ref 3.6–5.2)
Protein (Total): 6.8 g/dL (ref 6.0–8.2)
Sodium: 139 meq/L (ref 135–145)
Urea Nitrogen: 15 mg/dL (ref 8–21)
eGFR by CKD-EPI 2021: >60 mL/min/{1.73_m2} (ref >59)

## 2023-04-30 LAB — LIPID PANEL
Cholesterol/HDL Ratio: 4.6
HDL Cholesterol: 50 mg/dL (ref >39)
LDL Cholesterol, NIH Equation: 133 mg/dL — ABNORMAL HIGH (ref <130)
Non-HDL Cholesterol: 180 mg/dL — ABNORMAL HIGH (ref 0–159)
Total Cholesterol: 230 mg/dL — ABNORMAL HIGH (ref <200)
Triglyceride: 265 mg/dL — ABNORMAL HIGH (ref <150)

## 2023-04-30 LAB — TSH WITH REFLEXIVE FREE T4: Thyroid Stimulating Hormone: 4.582 u[IU]/mL (ref 0.400–5.000)

## 2023-04-30 NOTE — Progress Notes (Signed)
Vaccine Screening Questions    Interpreter: No    1. Are you allergic to Latex? NO    2.  Have you had a serious reaction or an allergic reaction to a vaccine?  NO    3.  Currently have a moderate or severe illness, including fever?  NO    4.  Ever had a seizure or any neurological problem associated with a vaccine? (DTaP/TDaP/DTP pertinent) NO    5.  Is patient receiving any live vaccinations today? (Varicella-Chickenpox, MMR-Measles/Mumps/Rubella, Zoster-Shingles, Flumist, Yellow Fever) NOTE: oral rotavirus is exempt  NO    If YES to any of the questions above - Do NOT give vaccine.  Consult with RN or provider in clinic.  (#5 can be YES if all Live vaccine questions are answered NO)    If NO to all questions above - Patient may receive vaccine.    6. Are you pregnant or is there a chance you could become pregnant during the next month (HPV pertinent) NO    7. Do you need to receive the Flu vaccine today? NO    All patients are encouraged to wait 15 minutes before leaving after receiving any vaccine.    VIS given 04/30/2023 by Scotty Court, CMA.  Medication Administrations This Visit         pneumococcal 20-valent conjugate vaccine (Prevnar 20) injection 0.5 mL Admin Date  04/30/2023  10:34 Action  Given Dose  0.5 mL Route  Intramuscular Site  Left Deltoid Documented By  Scotty Court, CMA    Ordering Provider: Elsie Saas, MD    NDC: 0005-2000-01    Lot#: [id number]        Tdap vaccine (Boostrix) injection 0.5 mL Admin Date  04/30/2023  10:33 Action  Given Dose  0.5 mL Route  Intramuscular Site  Right Deltoid Documented By  Scotty Court, CMA    Ordering Provider: Elsie Saas, MD    NDC: 44010-272-53    Lot#: [id number]             Vaccine given today without initial adverse effect. YES    Anne-Louise N Haeden Hudock, CMA      Patient came in for immunizations, had blood work done prior to visit and concerned about swelling on right forearm near wrist. Patient requested a provider to look at the  arm. Dr. Mosetta Putt saw patient. Routed to Dr. Mosetta Putt

## 2023-05-01 LAB — HEMOGLOBIN A1C, HPLC: Hemoglobin A1C: 5 % (ref 4.0–6.0)

## 2023-05-02 ENCOUNTER — Other Ambulatory Visit (INDEPENDENT_AMBULATORY_CARE_PROVIDER_SITE_OTHER): Payer: Self-pay | Admitting: Family Medicine

## 2023-05-02 DIAGNOSIS — E782 Mixed hyperlipidemia: Secondary | ICD-10-CM

## 2023-05-02 DIAGNOSIS — R7989 Other specified abnormal findings of blood chemistry: Secondary | ICD-10-CM

## 2023-05-02 LAB — LIPOPROTEIN(A) (SENDOUT): Lipoprotein(a): <7 nmol/L (ref <75)

## 2023-08-03 ENCOUNTER — Other Ambulatory Visit (INDEPENDENT_AMBULATORY_CARE_PROVIDER_SITE_OTHER): Payer: Self-pay | Admitting: Family Medicine

## 2023-08-03 DIAGNOSIS — I10 Essential (primary) hypertension: Secondary | ICD-10-CM

## 2023-08-04 ENCOUNTER — Other Ambulatory Visit (INDEPENDENT_AMBULATORY_CARE_PROVIDER_SITE_OTHER): Payer: Self-pay | Admitting: Family Medicine

## 2023-08-04 DIAGNOSIS — I10 Essential (primary) hypertension: Secondary | ICD-10-CM

## 2023-08-07 MED ORDER — LOSARTAN POTASSIUM 50 MG OR TABS
50.0000 mg | ORAL_TABLET | Freq: Every day | ORAL | 2 refills | Status: AC
Start: 2023-08-07 — End: ?

## 2023-08-07 MED ORDER — HYDROCHLOROTHIAZIDE 25 MG OR TABS
ORAL_TABLET | ORAL | 2 refills | Status: AC
Start: 2023-08-07 — End: ?

## 2023-09-10 ENCOUNTER — Other Ambulatory Visit: Payer: Self-pay

## 2023-11-15 ENCOUNTER — Other Ambulatory Visit (INDEPENDENT_AMBULATORY_CARE_PROVIDER_SITE_OTHER): Payer: Self-pay | Admitting: Family Medicine

## 2023-11-15 DIAGNOSIS — Z780 Asymptomatic menopausal state: Secondary | ICD-10-CM

## 2023-11-15 DIAGNOSIS — N941 Unspecified dyspareunia: Secondary | ICD-10-CM

## 2023-11-15 DIAGNOSIS — N952 Postmenopausal atrophic vaginitis: Secondary | ICD-10-CM

## 2023-11-16 MED ORDER — ESTRADIOL 0.1 MG/GM VA CREA
1.0000 g | TOPICAL_CREAM | VAGINAL | 0 refills | Status: AC
Start: 2023-11-16 — End: ?

## 2023-12-02 ENCOUNTER — Encounter (HOSPITAL_BASED_OUTPATIENT_CLINIC_OR_DEPARTMENT_OTHER): Payer: Self-pay | Admitting: Obstetrics & Gynecology

## 2023-12-02 DIAGNOSIS — N94819 Vulvodynia, unspecified: Secondary | ICD-10-CM

## 2023-12-02 DIAGNOSIS — N941 Unspecified dyspareunia: Secondary | ICD-10-CM

## 2023-12-03 MED ORDER — BETAMETHASONE DIPROPIONATE 0.05 % EX OINT
TOPICAL_OINTMENT | Freq: Every day | CUTANEOUS | 3 refills | Status: AC
Start: 2023-12-03 — End: ?

## 2023-12-03 NOTE — Telephone Encounter (Signed)
 Forward Rx request to Dr.Burke for review and approval.     Last OV: 02/16/22

## 2023-12-03 NOTE — Addendum Note (Signed)
 Addended by: Eppie Gibson on: 12/03/2023 05:25 PM     Modules accepted: Orders

## 2024-03-24 ENCOUNTER — Other Ambulatory Visit: Payer: Self-pay

## 2024-04-25 ENCOUNTER — Other Ambulatory Visit: Payer: Self-pay | Admitting: Gastroenterology

## 2024-04-25 DIAGNOSIS — Z1211 Encounter for screening for malignant neoplasm of colon: Secondary | ICD-10-CM

## 2024-10-02 ENCOUNTER — Encounter (INDEPENDENT_AMBULATORY_CARE_PROVIDER_SITE_OTHER): Payer: Self-pay | Admitting: Family Medicine

## 2024-12-16 ENCOUNTER — Encounter (INDEPENDENT_AMBULATORY_CARE_PROVIDER_SITE_OTHER): Payer: Self-pay | Admitting: Family Medicine

## 2024-12-16 ENCOUNTER — Other Ambulatory Visit: Payer: Self-pay | Admitting: Gastroenterology

## 2024-12-16 DIAGNOSIS — Z1212 Encounter for screening for malignant neoplasm of rectum: Secondary | ICD-10-CM
